# Patient Record
Sex: Female | Born: 1945 | Race: Black or African American | Hispanic: No | Smoking: Never smoker
Health system: Southern US, Community
[De-identification: ages and names within clinical notes are randomized; demographics above are authoritative.]

## PROBLEM LIST (undated history)

## (undated) DIAGNOSIS — I10 Essential (primary) hypertension: Secondary | ICD-10-CM

## (undated) DIAGNOSIS — E785 Hyperlipidemia, unspecified: Secondary | ICD-10-CM

## (undated) HISTORY — PX: ABDOMINAL HYSTERECTOMY: SHX81

---

## 2004-12-27 ENCOUNTER — Ambulatory Visit: Payer: Self-pay | Admitting: Unknown Physician Specialty

## 2005-11-03 ENCOUNTER — Ambulatory Visit: Payer: Self-pay | Admitting: Unknown Physician Specialty

## 2006-02-13 ENCOUNTER — Ambulatory Visit: Payer: Self-pay | Admitting: Unknown Physician Specialty

## 2007-02-19 ENCOUNTER — Ambulatory Visit: Payer: Self-pay | Admitting: Unknown Physician Specialty

## 2007-04-04 ENCOUNTER — Ambulatory Visit: Payer: Self-pay | Admitting: Gastroenterology

## 2008-03-03 ENCOUNTER — Ambulatory Visit: Payer: Self-pay | Admitting: Unknown Physician Specialty

## 2008-03-06 ENCOUNTER — Ambulatory Visit: Payer: Self-pay | Admitting: Unknown Physician Specialty

## 2009-03-12 ENCOUNTER — Ambulatory Visit: Payer: Self-pay | Admitting: Unknown Physician Specialty

## 2010-05-09 ENCOUNTER — Ambulatory Visit: Payer: Self-pay | Admitting: Unknown Physician Specialty

## 2010-06-06 ENCOUNTER — Ambulatory Visit: Payer: Self-pay | Admitting: Unknown Physician Specialty

## 2011-07-06 ENCOUNTER — Ambulatory Visit: Payer: Self-pay | Admitting: Unknown Physician Specialty

## 2012-07-09 ENCOUNTER — Ambulatory Visit: Payer: Self-pay | Admitting: Physician Assistant

## 2013-07-10 ENCOUNTER — Ambulatory Visit: Payer: Self-pay | Admitting: Physician Assistant

## 2013-08-04 ENCOUNTER — Ambulatory Visit: Payer: Self-pay | Admitting: Gastroenterology

## 2014-04-28 ENCOUNTER — Ambulatory Visit: Payer: Self-pay | Admitting: Family Medicine

## 2014-07-13 ENCOUNTER — Ambulatory Visit: Payer: Self-pay | Admitting: Physician Assistant

## 2015-06-24 ENCOUNTER — Other Ambulatory Visit: Payer: Self-pay | Admitting: Physician Assistant

## 2015-06-24 DIAGNOSIS — Z1231 Encounter for screening mammogram for malignant neoplasm of breast: Secondary | ICD-10-CM

## 2015-07-15 ENCOUNTER — Other Ambulatory Visit: Payer: Self-pay | Admitting: Physician Assistant

## 2015-07-15 ENCOUNTER — Ambulatory Visit
Admission: RE | Admit: 2015-07-15 | Discharge: 2015-07-15 | Disposition: A | Discharge status: Home / Self Care | Payer: Medicare Other | Source: Ambulatory Visit | Attending: Physician Assistant | Admitting: Physician Assistant

## 2015-07-15 DIAGNOSIS — Z1231 Encounter for screening mammogram for malignant neoplasm of breast: Secondary | ICD-10-CM | POA: Diagnosis present

## 2015-09-30 DIAGNOSIS — D239 Other benign neoplasm of skin, unspecified: Secondary | ICD-10-CM

## 2015-09-30 HISTORY — DX: Other benign neoplasm of skin, unspecified: D23.9

## 2016-05-31 ENCOUNTER — Ambulatory Visit
Admission: EM | Admit: 2016-05-31 | Discharge: 2016-05-31 | Disposition: A | Discharge status: Home / Self Care | Payer: Medicare Other | Attending: Family Medicine | Admitting: Family Medicine

## 2016-05-31 DIAGNOSIS — S1096XA Insect bite of unspecified part of neck, initial encounter: Secondary | ICD-10-CM

## 2016-05-31 DIAGNOSIS — W57XXXA Bitten or stung by nonvenomous insect and other nonvenomous arthropods, initial encounter: Secondary | ICD-10-CM

## 2016-05-31 HISTORY — DX: Hyperlipidemia, unspecified: E78.5

## 2016-05-31 HISTORY — DX: Essential (primary) hypertension: I10

## 2016-05-31 MED ORDER — MUPIROCIN 2 % EX OINT: TOPICAL_OINTMENT | CUTANEOUS | Status: DC

## 2016-05-31 NOTE — ED Notes (Signed)
Patient complains of a tick bite. Patient states that she found the tick on July 1st on the nape of her neck. Patient states that she brought the tick with her today. Patient states that she has noticed some redness and swelling where the tick was.

## 2016-05-31 NOTE — Discharge Instructions (Signed)
Use medication as prescribed. Monitor area. Avoid scratching.   Follow up with your primary care physician this week as needed. Prompt follow up as discussed for any symptoms including headache, rash, joint pain, fevers, weakness or other concerns. Return to Urgent care for new or worsening concerns.    Tick Bite Information Ticks are insects that attach themselves to the skin and draw blood for food. There are various types of ticks. Common types include wood ticks and deer ticks. Most ticks live in shrubs and grassy areas. Ticks can climb onto your body when you make contact with leaves or grass where the tick is waiting. The most common places on the body for ticks to attach themselves are the scalp, neck, armpits, waist, and groin. Most tick bites are harmless, but sometimes ticks carry germs that cause diseases. These germs can be spread to a person during the tick's feeding process. The chance of a disease spreading through a tick bite depends on:   The type of tick.  Time of year.   How long the tick is attached.   Geographic location.  HOW CAN YOU PREVENT TICK BITES? Take these steps to help prevent tick bites when you are outdoors:  Wear protective clothing. Long sleeves and long pants are best.   Wear white clothes so you can see ticks more easily.  Tuck your pant legs into your socks.   If walking on a trail, stay in the middle of the trail to avoid brushing against bushes.  Avoid walking through areas with long grass.  Put insect repellent on all exposed skin and along boot tops, pant legs, and sleeve cuffs.   Check clothing, hair, and skin repeatedly and before going inside.   Brush off any ticks that are not attached.  Take a shower or bath as soon as possible after being outdoors.  WHAT IS THE PROPER WAY TO REMOVE A TICK? Ticks should be removed as soon as possible to help prevent diseases caused by tick bites.  If latex gloves are available, put them  on before trying to remove a tick.   Using fine-point tweezers, grasp the tick as close to the skin as possible. You may also use curved forceps or a tick removal tool. Grasp the tick as close to its head as possible. Avoid grasping the tick on its body.  Pull gently with steady upward pressure until the tick lets go. Do not twist the tick or jerk it suddenly. This may break off the tick's head or mouth parts.  Do not squeeze or crush the tick's body. This could force disease-carrying fluids from the tick into your body.   After the tick is removed, wash the bite area and your hands with soap and water or other disinfectant such as alcohol.  Apply a small amount of antiseptic cream or ointment to the bite site.   Wash and disinfect any instruments that were used.  Do not try to remove a tick by applying a hot match, petroleum jelly, or fingernail polish to the tick. These methods do not work and may increase the chances of disease being spread from the tick bite.  WHEN SHOULD YOU SEEK MEDICAL CARE? Contact your health care provider if you are unable to remove a tick from your skin or if a part of the tick breaks off and is stuck in the skin.  After a tick bite, you need to be aware of signs and symptoms that could be related to diseases spread  by ticks. Contact your health care provider if you develop any of the following in the days or weeks after the tick bite:  Unexplained fever.  Rash. A circular rash that appears days or weeks after the tick bite may indicate the possibility of Lyme disease. The rash may resemble a target with a bull's-eye and may occur at a different part of your body than the tick bite.  Redness and swelling in the area of the tick bite.   Tender, swollen lymph glands.   Diarrhea.   Weight loss.   Cough.   Fatigue.   Muscle, joint, or bone pain.   Abdominal pain.   Headache.   Lethargy or a change in your level of  consciousness.  Difficulty walking or moving your legs.   Numbness in the legs.   Paralysis.  Shortness of breath.   Confusion.   Repeated vomiting.    This information is not intended to replace advice given to you by your health care provider. Make sure you discuss any questions you have with your health care provider.   Document Released: 11/03/2000 Document Revised: 11/27/2014 Document Reviewed: 04/16/2013 Elsevier Interactive Patient Education 2016 Beaver, flies, fleas, bedbugs, and many other insects can bite. Insect bites are different from insect stings. A sting is when poison (venom) is injected into the skin. Insect bites can cause pain or itching for a few days, but they are usually not serious. Some insects can spread diseases to people through a bite. SYMPTOMS  Symptoms of an insect bite include:  Itching or pain in the bite area.  Redness and swelling in the bite area.  An open wound (skin ulcer). In many cases, symptoms last for 2-4 days.  DIAGNOSIS  This condition is usually diagnosed based on symptoms and a physical exam. TREATMENT  Treatment is usually not needed for an insect bite. Symptoms often go away on their own. Your health care provider may recommend creams or lotions to help reduce itching. Antibiotic medicines may be prescribed if the bite becomes infected. A tetanus shot may be given in some cases. If you develop an allergic reaction to an insect bite, your health care provider will prescribe medicines to treat the reaction (antihistamines). This is rare. HOME CARE INSTRUCTIONS  Do not scratch the bite area.  Keep the bite area clean and dry. Wash the bite area daily with soap and water as told by your health care provider.  If directed, applyice to the bite area.  Put ice in a plastic bag.  Place a towel between your skin and the bag.  Leave the ice on for 20 minutes, 2-3 times per day.  To help reduce  itching and swelling, try applying a baking soda paste, cortisone cream, or calamine lotion to the bite area as told by your health care provider.  Apply or take over-the-counter and prescription medicines only as told by your health care provider.  If you were prescribed an antibiotic medicine, use it as told by your health care provider. Do not stop using the antibiotic even if your condition improves.  Keep all follow-up visits as told by your health care provider. This is important. PREVENTION   Use insect repellent. The best insect repellents contain:  DEET, picaridin, oil of lemon eucalyptus (OLE), or IR3535.  Higher amounts of an active ingredient.  When you are outdoors, wear clothing that covers your arms and legs.  Avoid opening windows that do not have window screens.  SEEK MEDICAL CARE IF:  You have increased redness, swelling, or pain in the bite area.  You have a fever. SEEK IMMEDIATE MEDICAL CARE IF:   You have joint pain.   You have fluid, blood, or pus coming from the bite area.  You have a headache or neck pain.  You have unusual weakness.  You have a rash.  You have chest pain or shortness of breath.  You have abdominal pain, nausea, or vomiting.  You feel unusually tired or sleepy.   This information is not intended to replace advice given to you by your health care provider. Make sure you discuss any questions you have with your health care provider.   Document Released: 12/14/2004 Document Revised: 07/28/2015 Document Reviewed: 03/24/2015 Elsevier Interactive Patient Education [employer].

## 2016-05-31 NOTE — ED Provider Notes (Signed)
[facility]  ____________________________________________  Time seen: Approximately 10:36 AM  I have reviewed the triage vital signs and the nursing notes.   HISTORY  Chief Complaint Insect Bite   HPI Stephanie Lynn is a 70 y.o. female   presents for evaluation of the area where she was bitten by tick. Patient reports in July 1 she had a tick in the back of her head in which her daughter removed. Patient reports that she had also found one crawling in her bathroom floor that was not attached to her. Denies any other tick bite or tick attachment. States unsure exactly how long the tick was attached but states thinks it is less than a few hours. Patient states that she feels well, but states she came in today as she feels there is a slight bump where the tick was attached and it itches.  Denies fevers, joint pain, body aches, headaches, weakness, nausea, vomiting, diarrhea, chest pain, shortness of breath, dysuria, neck pain, back pain or extremity swelling. Patient states that she does have some intermittent runny nose, nasal congestion and sinus congestion consistent with her seasonal allergies. Denies any other complaints or symptoms.    Past Medical History  Diagnosis Date  . Hypertension   . Hyperlipidemia     There are no active problems to display for this patient.   Past Surgical History  Procedure Laterality Date  . Abdominal hysterectomy      Current Outpatient Rx  Name  Route  Sig  Dispense  Refill  . aspirin EC 81 MG tablet   Oral   Take 81 mg by mouth daily.         Marland Kitchen atorvastatin (LIPITOR) 20 MG tablet   Oral   Take 20 mg by mouth daily.         . Calcium Carbonate-Vit D-Min (CALCIUM 1200 PO)   Oral   Take by mouth.         . triamterene-hydrochlorothiazide (MAXZIDE-25) 37.5-25 MG tablet   Oral   Take 1 tablet by mouth daily.           Allergies Codeine  Family History  Problem Relation Age of Onset  . Cirrhosis Father      Social History Social History  Substance Use Topics  . Smoking status: Never Smoker   . Smokeless tobacco: None  . Alcohol Use: No   Review of Systems Constitutional: No fever/chills Eyes: No visual changes. ENT: No sore throat. Cardiovascular: Denies chest pain. Respiratory: Denies shortness of breath. Gastrointestinal: No abdominal pain. No nausea, no vomiting. No diarrhea. No constipation. Genitourinary: Negative for dysuria. Musculoskeletal: Negative for back pain. Skin: Negative for rash. As above.  Neurological: Negative for headaches, focal weakness or numbness.  10-point ROS otherwise negative.  ____________________________________________   PHYSICAL EXAM:  VITAL SIGNS: ED Triage Vitals  Enc Vitals Group     BP 05/31/16 1032 128/47 mmHg     Pulse Rate 05/31/16 1032 80     Resp 05/31/16 1032 17     Temp 05/31/16 1032 97.7 F (36.5 C)     Temp Source 05/31/16 1032 Tympanic     SpO2 05/31/16 1032 100 %     Weight 05/31/16 1032 129 lb (58.514 kg)     Height 05/31/16 1032 5\' 6"  (1.676 m)     Head Cir --      Peak Flow --      Pain Score --      Pain Loc --  Pain Edu? --      Excl. in [place]? --     Constitutional: Alert and oriented. Well appearing and in no acute distress. Eyes: Conjunctivae are normal. PERRL. EOMI. Head: Atraumatic.  Ears: no erythema, normal TMs bilaterally.   Nose: No congestion/rhinnorhea.  Mouth/Throat: Mucous membranes are moist.  Oropharynx non-erythematous. No tonsillar swelling or exudate.  Neck: No stridor.  No cervical spine tenderness to palpation. Hematological/Lymphatic/Immunilogical: No cervical lymphadenopathy. Cardiovascular: Normal rate, regular rhythm. Grossly normal heart sounds.  Good peripheral circulation. Respiratory: Normal respiratory effort.  No retractions. Lungs CTAB. No wheezes, rales or rhonchi.  Gastrointestinal: Soft and nontender. No distention. No CVA tenderness. Musculoskeletal: No lower or upper  extremity tenderness nor edema.  Neurologic:  Normal speech and language. No gross focal neurologic deficits are appreciated. No gait instability. No meningismus.  Skin:  Skin is warm, dry and intact. No rash noted. Except:  Less than 1 cm area of minimally indurated skin at posterior occiput of hairline which patient reports the same as tick bite site; No erythema, no swelling, no surrounding erythema or rash. No neck tenderness. No bulls-eye rash.  Psychiatric: Mood and affect are normal. Speech and behavior are normal.  ____________________________________________   LABS (all labs ordered are listed, but only abnormal results are displayed)  Labs Reviewed - No data to display  RADIOLOGY  No results found. ____________________________________________   INITIAL IMPRESSION / ASSESSMENT AND PLAN / ED COURSE  Pertinent labs & imaging results that were available during my care of the patient were reviewed by me and considered in my medical decision making (see chart for details).  Very well-appearing patient. No acute distress. Presents for complaint and evaluation of recent tick bite. Less than 1 cm area of minimally indurated skin at posterior occiput of hairline which patient reports the same as tick bite site. No erythema, surrounding erythema or rash. No neck tenderness. Denies other complaints. Suspect local reaction from tick bite and will treat with topical Bactroban. Discussed with patient evaluation of [facility] spotted fever and Lyme's disease labs, and patient declines these labs. Patient states that she'll follow up with her primary care physician as needed but does not want any lab work drawn at this time as she feels well. Encourage patient to closely monitor the area and have daughter checked the area as well.Discussed indication, risks and benefits of medications with patient.  Discussed follow up with Primary care physician this week. Discussed follow up and return  parameters including no resolution or any worsening concerns. Patient verbalized understanding and agreed to plan.   ____________________________________________   FINAL CLINICAL IMPRESSION(S) / ED DIAGNOSES  Final diagnoses:  Tick bite of neck, initial encounter     Discharge Medication List as of 05/31/2016 10:47 AM    START taking these medications   Details  mupirocin ointment (BACTROBAN) 2 % Apply three times a day for 5 days., Normal        Note: This dictation was prepared with Dragon dictation along with smaller phrase technology. Any transcriptional errors that result from this process are unintentional.       Marylene Land, NP 05/31/16 1249

## 2016-06-05 ENCOUNTER — Other Ambulatory Visit: Payer: Self-pay | Admitting: Physician Assistant

## 2016-06-05 DIAGNOSIS — Z1231 Encounter for screening mammogram for malignant neoplasm of breast: Secondary | ICD-10-CM

## 2016-07-17 ENCOUNTER — Other Ambulatory Visit: Payer: Self-pay | Admitting: Physician Assistant

## 2016-07-17 ENCOUNTER — Ambulatory Visit
Admission: RE | Admit: 2016-07-17 | Discharge: 2016-07-17 | Disposition: A | Discharge status: Home / Self Care | Payer: Medicare Other | Source: Ambulatory Visit | Attending: Physician Assistant | Admitting: Physician Assistant

## 2016-07-17 DIAGNOSIS — Z1231 Encounter for screening mammogram for malignant neoplasm of breast: Secondary | ICD-10-CM

## 2017-06-12 ENCOUNTER — Other Ambulatory Visit: Payer: Self-pay | Admitting: Physician Assistant

## 2017-06-12 DIAGNOSIS — Z1231 Encounter for screening mammogram for malignant neoplasm of breast: Secondary | ICD-10-CM

## 2017-07-19 ENCOUNTER — Ambulatory Visit
Admission: RE | Admit: 2017-07-19 | Discharge: 2017-07-19 | Disposition: A | Discharge status: Home / Self Care | Payer: Medicare Other | Source: Ambulatory Visit | Attending: Physician Assistant | Admitting: Physician Assistant

## 2017-07-19 DIAGNOSIS — Z1231 Encounter for screening mammogram for malignant neoplasm of breast: Secondary | ICD-10-CM | POA: Diagnosis not present

## 2017-10-03 ENCOUNTER — Emergency Department (HOSPITAL_COMMUNITY)
Admission: EM | Admit: 2017-10-03 | Discharge: 2017-10-03 | Disposition: A | Discharge status: Home / Self Care | Payer: Medicare Other | Attending: Emergency Medicine | Admitting: Emergency Medicine

## 2017-10-03 ENCOUNTER — Encounter (HOSPITAL_COMMUNITY): Payer: Self-pay | Admitting: Emergency Medicine

## 2017-10-03 ENCOUNTER — Other Ambulatory Visit: Payer: Self-pay

## 2017-10-03 DIAGNOSIS — L509 Urticaria, unspecified: Secondary | ICD-10-CM

## 2017-10-03 DIAGNOSIS — Z7982 Long term (current) use of aspirin: Secondary | ICD-10-CM | POA: Insufficient documentation

## 2017-10-03 DIAGNOSIS — I10 Essential (primary) hypertension: Secondary | ICD-10-CM | POA: Insufficient documentation

## 2017-10-03 DIAGNOSIS — R21 Rash and other nonspecific skin eruption: Secondary | ICD-10-CM | POA: Diagnosis present

## 2017-10-03 DIAGNOSIS — Z79899 Other long term (current) drug therapy: Secondary | ICD-10-CM | POA: Insufficient documentation

## 2017-10-03 MED ORDER — FAMOTIDINE 20 MG PO TABS
20.0000 mg | ORAL_TABLET | Freq: Once | ORAL | Status: AC
  Administered 2017-10-03: 20 mg via ORAL
  Filled 2017-10-03: qty 1

## 2017-10-03 MED ORDER — DIPHENHYDRAMINE HCL 25 MG PO CAPS
25.0000 mg | ORAL_CAPSULE | Freq: Once | ORAL | Status: AC
  Administered 2017-10-03: 25 mg via ORAL
  Filled 2017-10-03: qty 1

## 2017-10-03 MED ORDER — FAMOTIDINE 20 MG PO TABS: 20.0000 mg | ORAL_TABLET | Freq: Two times a day (BID) | ORAL | 0 refills | Status: DC

## 2017-10-03 NOTE — ED Triage Notes (Signed)
Pt st's she was sleeping and woke up approx 2 hours ago itching all over.  Pt has rash on bil legs, arms and torso.  No resp distress present

## 2017-10-03 NOTE — ED Provider Notes (Signed)
[name] Provider Note   CSN: [id number] Arrival date & time: 10/03/17  6073     History   Chief Complaint Chief Complaint  Patient presents with  . Allergic Reaction    HPI Stephanie Lynn is a 71 y.o. female.  Patient presents to the emergency department with chief complaint of rash.  She states that she was awakened at 11 PM this evening with a rash on her torso, arms, and legs.  She describes the rash as itchy.  She denies any pain.  Denies any fever, chills, nausea, vomiting, diarrhea, shortness of breath, or throat swelling sensation.  She has not taken anything for symptoms.  There are no modifying factors.  She denies any potential allergic contacts.  Denies any new chemical contacts.  Denies any new foods.  She has not had anything like this before.  She does report that she was bitten by a tick about 2 months ago.  She denies any muscle aches, joint pain, or fatigue.   The history is provided by the patient. No language interpreter was used.    Past Medical History:  Diagnosis Date  . Hyperlipidemia   . Hypertension     There are no active problems to display for this patient.   Past Surgical History:  Procedure Laterality Date  . ABDOMINAL HYSTERECTOMY      OB History    No data available       Home Medications    Prior to Admission medications   Medication Sig Start Date End Date Taking? Authorizing Provider  aspirin EC 81 MG tablet Take 81 mg by mouth daily.    [provider]  atorvastatin (LIPITOR) 20 MG tablet Take 20 mg by mouth daily.    [provider]  Calcium Carbonate-Vit D-Min (CALCIUM 1200 PO) Take by mouth.    [provider]  mupirocin ointment (BACTROBAN) 2 % Apply three times a day for 5 days. 05/31/16   Marylene Land, NP  triamterene-hydrochlorothiazide (MAXZIDE-25) 37.5-25 MG tablet Take 1 tablet by mouth daily.    [provider]    Family  History Family History  Problem Relation Age of Onset  . Cirrhosis Father   . Breast cancer Neg Hx     Social History Social History   Tobacco Use  . Smoking status: Never Smoker  Substance Use Topics  . Alcohol use: No    Alcohol/week: 0.0 oz  . Drug use: No     Allergies   Codeine   Review of Systems Review of Systems  All other systems reviewed and are negative.    Physical Exam Updated Vital Signs BP (!) 154/81 (BP Location: Right Arm)   Pulse 83   Temp 97.8 F (36.6 C) (Oral)   Resp 18   Ht 5\' 4"  (1.626 m)   Wt 62.1 kg (137 lb)   SpO2 97%   BMI 23.52 kg/m   Physical Exam  Constitutional: She is oriented to person, place, and time. She appears well-developed and well-nourished.  HENT:  Head: Normocephalic and atraumatic.  Eyes: Conjunctivae and EOM are normal. Pupils are equal, round, and reactive to light.  Neck: Normal range of motion. Neck supple.  Cardiovascular: Normal rate and regular rhythm. Exam reveals no gallop and no friction rub.  No murmur heard. Pulmonary/Chest: Effort normal and breath sounds normal. No respiratory distress. She has no wheezes. She has no rales. She exhibits no tenderness.  Abdominal: Soft. Bowel sounds are normal.  She exhibits no distension and no mass. There is no tenderness. There is no rebound and no guarding.  Musculoskeletal: Normal range of motion. She exhibits no edema or tenderness.  Neurological: She is alert and oriented to person, place, and time.  Skin: Skin is warm and dry.  Psychiatric: She has a normal mood and affect. Her behavior is normal. Judgment and thought content normal.  Nursing note and vitals reviewed.    ED Treatments / Results  Labs (all labs ordered are listed, but only abnormal results are displayed) Labs Reviewed - No data to display  EKG  EKG Interpretation None       Radiology No results found.  Procedures Procedures (including critical care time)  Medications Ordered in  ED Medications  diphenhydrAMINE (BENADRYL) capsule 25 mg (25 mg Oral Given 10/03/17 0503)  famotidine (PEPCID) tablet 20 mg (20 mg Oral Given 10/03/17 0503)     Initial Impression / Assessment and Plan / ED Course  I have reviewed the triage vital signs and the nursing notes.  Pertinent labs & imaging results that were available during my care of the patient were reviewed by me and considered in my medical decision making (see chart for details).     Patient with pruritic rash.  Appears consistent with hives.  No new potential irritants.  No fever or chills.  No throat swelling, wheezing, or GI symptoms.  Patient feels significantly improved after Benadryl and Pepcid.  However, the rash remains.  Patient with Dr. Leonides Schanz, who also saw the rash, and agrees with plan for discharge home.  Return precautions discussed.  Final Clinical Impressions(s) / ED Diagnoses   Final diagnoses:  Hives    ED Discharge Orders        Ordered    famotidine (PEPCID) 20 MG tablet  2 times daily     10/03/17 0545       Montine Circle, PA-C 10/03/17 [place], Delice Bison, DO 10/03/17 [id number]

## 2018-06-10 ENCOUNTER — Other Ambulatory Visit: Payer: Self-pay | Admitting: Physician Assistant

## 2018-06-10 DIAGNOSIS — Z1231 Encounter for screening mammogram for malignant neoplasm of breast: Secondary | ICD-10-CM

## 2018-08-01 ENCOUNTER — Ambulatory Visit
Admission: RE | Admit: 2018-08-01 | Discharge: 2018-08-01 | Disposition: A | Discharge status: Home / Self Care | Payer: Medicare Other | Source: Ambulatory Visit | Attending: Physician Assistant | Admitting: Physician Assistant

## 2018-08-01 DIAGNOSIS — Z1231 Encounter for screening mammogram for malignant neoplasm of breast: Secondary | ICD-10-CM | POA: Diagnosis present

## 2019-06-24 ENCOUNTER — Other Ambulatory Visit: Payer: Self-pay | Admitting: Physician Assistant

## 2019-06-24 DIAGNOSIS — Z1231 Encounter for screening mammogram for malignant neoplasm of breast: Secondary | ICD-10-CM

## 2019-08-04 ENCOUNTER — Ambulatory Visit
Admission: RE | Admit: 2019-08-04 | Discharge: 2019-08-04 | Disposition: A | Discharge status: Home / Self Care | Payer: Medicare Other | Source: Ambulatory Visit | Attending: Physician Assistant | Admitting: Physician Assistant

## 2019-08-04 DIAGNOSIS — Z1231 Encounter for screening mammogram for malignant neoplasm of breast: Secondary | ICD-10-CM | POA: Insufficient documentation

## 2019-08-06 ENCOUNTER — Other Ambulatory Visit: Payer: Self-pay | Admitting: Physician Assistant

## 2019-08-06 DIAGNOSIS — K30 Functional dyspepsia: Secondary | ICD-10-CM

## 2019-08-15 ENCOUNTER — Ambulatory Visit
Admission: RE | Admit: 2019-08-15 | Discharge: 2019-08-15 | Disposition: A | Discharge status: Home / Self Care | Payer: Medicare Other | Source: Ambulatory Visit | Attending: Physician Assistant | Admitting: Physician Assistant

## 2019-08-15 ENCOUNTER — Other Ambulatory Visit: Payer: Self-pay

## 2019-08-15 DIAGNOSIS — K30 Functional dyspepsia: Secondary | ICD-10-CM | POA: Diagnosis not present

## 2020-02-07 ENCOUNTER — Ambulatory Visit: Payer: Medicare Other | Attending: Internal Medicine

## 2020-02-07 DIAGNOSIS — Z23 Encounter for immunization: Secondary | ICD-10-CM

## 2020-02-07 NOTE — Progress Notes (Signed)
   Covid-19 Vaccination Clinic  Name:  Stephanie Lynn    MRN: [id number] DOB: Feb 07, 1946  02/07/2020  [name] was observed post Covid-19 immunization for 30 minutes based on pre-vaccination screening without incident. She was provided with Vaccine Information Sheet and instruction to access the V-Safe system.   [name] was instructed to call 911 with any severe reactions post vaccine: Marland Kitchen Difficulty breathing  . Swelling of face and throat  . A fast heartbeat  . A bad rash all over body  . Dizziness and weakness   Immunizations Administered    Name Date Dose VIS Date Route   Pfizer COVID-19 Vaccine 02/07/2020  9:13 AM 0.3 mL 10/31/2019 Intramuscular   Manufacturer: [place]   Lot: [id number]   [place]: [id number]

## 2020-02-28 ENCOUNTER — Ambulatory Visit: Payer: Medicare Other | Attending: Internal Medicine

## 2020-02-28 DIAGNOSIS — Z23 Encounter for immunization: Secondary | ICD-10-CM

## 2020-02-28 NOTE — Progress Notes (Signed)
   Covid-19 Vaccination Clinic  Name:  Eilley Lebreton    MRN: [id number] DOB: [birth year]  02/28/2020  Ms. Silfies was observed post Covid-19 immunization for 15 minutes without incident. She was provided with Vaccine Information Sheet and instruction to access the V-Safe system.   Ms. Henshall was instructed to call 911 with any severe reactions post vaccine: Marland Kitchen Difficulty breathing  . Swelling of face and throat  . A fast heartbeat  . A bad rash all over body  . Dizziness and weakness   Immunizations Administered    Name Date Dose VIS Date Route   Pfizer COVID-19 Vaccine 02/28/2020  8:55 AM 0.3 mL 10/31/2019 Intramuscular   Manufacturer: [place]   Lot: [id number]   [place]: [id number]

## 2020-06-28 ENCOUNTER — Other Ambulatory Visit: Payer: Self-pay | Admitting: Physician Assistant

## 2020-06-28 DIAGNOSIS — Z1231 Encounter for screening mammogram for malignant neoplasm of breast: Secondary | ICD-10-CM

## 2020-08-05 ENCOUNTER — Other Ambulatory Visit: Payer: Self-pay

## 2020-08-05 ENCOUNTER — Ambulatory Visit
Admission: RE | Admit: 2020-08-05 | Discharge: 2020-08-05 | Disposition: A | Discharge status: Home / Self Care | Payer: Medicare Other | Source: Ambulatory Visit | Attending: Physician Assistant | Admitting: Physician Assistant

## 2020-08-05 DIAGNOSIS — Z1231 Encounter for screening mammogram for malignant neoplasm of breast: Secondary | ICD-10-CM | POA: Insufficient documentation

## 2020-09-07 ENCOUNTER — Other Ambulatory Visit: Payer: Self-pay

## 2020-09-07 ENCOUNTER — Encounter: Payer: Self-pay | Admitting: Dermatology

## 2020-09-07 ENCOUNTER — Ambulatory Visit: Payer: Medicare Other | Admitting: Dermatology

## 2020-09-07 DIAGNOSIS — D2271 Melanocytic nevi of right lower limb, including hip: Secondary | ICD-10-CM

## 2020-09-07 DIAGNOSIS — Z86018 Personal history of other benign neoplasm: Secondary | ICD-10-CM

## 2020-09-07 DIAGNOSIS — L578 Other skin changes due to chronic exposure to nonionizing radiation: Secondary | ICD-10-CM

## 2020-09-07 DIAGNOSIS — D229 Melanocytic nevi, unspecified: Secondary | ICD-10-CM

## 2020-09-07 NOTE — Progress Notes (Signed)
   Follow-Up Visit   Subjective  Stephanie Lynn is a 74 y.o. female who presents for the following: Nevus (check nevus on R 5th toe, pcp feels it has changed in size) and Hx of dysplastic nevus (Hx of mild dysplastic L mid to upper back paraspinal, hx of melanocytic nevus R lateral elbow).  The following portions of the chart were reviewed this encounter and updated as appropriate: Allergies  Meds  Problems  Med Hx  Surg Hx  Fam Hx     Review of Systems: No other skin or systemic complaints except as noted in HPI or Assessment and Plan.   Objective  Well appearing patient in no apparent distress; mood and affect are within normal limits.  A focused examination was performed including right foot and right elbow. Relevant physical exam findings are noted in the Assessment and Plan.  Objective  Right 5th Distal Toe: 0.6 cm x 0.45 cm multi-lobular flat brown macule. Unremarkable under dermoscopy.  Images    Objective  Right lateral elbow: scar    Assessment & Plan  Nevus Right 5th Distal Toe  Favor two adjacent nevi. Benign-appearing.  Observation.  Call clinic for new or changing lesions.  Recommend daily use of broad spectrum spf 30+ sunscreen to sun-exposed areas.   Observe for growth, change in shape or color, and raised elevation.   Discussed possible shave removal in future if any changes noticed.     History of dysplastic nevus Right lateral elbow  Benign-appearing.  Observation.  Call clinic for new or changing moles.  Recommend daily use of broad spectrum spf 30+ sunscreen to sun-exposed areas.    Actinic Damage - diffuse scaly erythematous macules with underlying dyspigmentation - Recommend daily broad spectrum sunscreen SPF 30+ to sun-exposed areas, reapply every 2 hours as needed.  - Call for new or changing lesions.  Return in about 3 months (around 12/08/2020) for recheck nevus R 5th toe.   I, Harriett Sine, CMA, am acting as scribe for  Forest Gleason, MD.  Documentation: I have reviewed the above documentation for accuracy and completeness, and I agree with the above.  Forest Gleason, MD

## 2020-09-07 NOTE — Patient Instructions (Signed)
Recommend taking Heliocare sun protection supplement daily in sunny weather for additional sun protection. For maximum protection on the sunniest days, you can take up to 2 capsules of regular Heliocare OR take 1 capsule of Heliocare Ultra. For prolonged exposure (such as a full day in the sun), you can repeat your dose of the supplement 4 hours after your first dose. Heliocare can be purchased at [facility] or at [email or web address].       

## 2020-09-14 ENCOUNTER — Encounter: Payer: Self-pay | Admitting: Dermatology

## 2020-12-22 ENCOUNTER — Ambulatory Visit: Payer: Medicare Other | Admitting: Dermatology

## 2020-12-28 ENCOUNTER — Encounter: Payer: Self-pay | Admitting: Dermatology

## 2020-12-28 ENCOUNTER — Ambulatory Visit: Payer: Medicare Other | Admitting: Dermatology

## 2020-12-28 ENCOUNTER — Other Ambulatory Visit: Payer: Self-pay

## 2020-12-28 DIAGNOSIS — Z86018 Personal history of other benign neoplasm: Secondary | ICD-10-CM

## 2020-12-28 DIAGNOSIS — D2271 Melanocytic nevi of right lower limb, including hip: Secondary | ICD-10-CM | POA: Diagnosis not present

## 2020-12-28 DIAGNOSIS — D229 Melanocytic nevi, unspecified: Secondary | ICD-10-CM

## 2020-12-28 DIAGNOSIS — L821 Other seborrheic keratosis: Secondary | ICD-10-CM | POA: Diagnosis not present

## 2020-12-28 NOTE — Progress Notes (Signed)
   Follow-Up Visit   Subjective  Stephanie Lynn is a 75 y.o. female who presents for the following: Nevus (Patient here today for 3 month follow up to recheck nevus at right 5th toe. Patient has not noticed any change. ).  The following portions of the chart were reviewed this encounter and updated as appropriate:   Allergies  Meds  Problems  Med Hx  Surg Hx  Fam Hx      Review of Systems:  No other skin or systemic complaints except as noted in HPI or Assessment and Plan.  Objective  Well appearing patient in no apparent distress; mood and affect are within normal limits.  A focused examination was performed including right foot. Relevant physical exam findings are noted in the Assessment and Plan.  Objective  Right 5th toe: 0.6 cm x 0.45 cm multi-lobular flat brown macule   Assessment & Plan  Nevus Right 5th toe  Photo compared, stable  Benign-appearing.  Observation.  Call clinic for new or changing lesions.  Recommend daily use of broad spectrum spf 30+ sunscreen to sun-exposed areas.    Seborrheic Keratoses - Stuck-on, waxy, tan-brown papules and plaques  - Discussed benign etiology and prognosis. 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  - Call if any new or changing lesions are noted between office visits   Return for TBSE.  Graciella Belton, RMA, am acting as scribe for Forest Gleason, MD .  Documentation: I have reviewed the above documentation for accuracy and completeness, and I agree with the above.  Forest Gleason, MD

## 2020-12-28 NOTE — Patient Instructions (Addendum)
Melanoma ABCDEs  Melanoma is the most dangerous type of skin cancer, and is the leading cause of death from skin disease.  You are more likely to develop melanoma if you:  Have light-colored skin, light-colored eyes, or red or blond hair  Spend a lot of time in the sun  Tan regularly, either outdoors or in a tanning bed  Have had blistering sunburns, especially during childhood  Have a close family member who has had a melanoma  Have atypical moles or large birthmarks  Early detection of melanoma is key since treatment is typically straightforward and cure rates are extremely high if we catch it early.   The first sign of melanoma is often a change in a mole or a new dark spot.  The ABCDE system is a way of remembering the signs of melanoma.  A for asymmetry:  The two halves do not match. B for border:  The edges of the growth are irregular. C for color:  A mixture of colors are present instead of an even brown color. D for diameter:  Melanomas are usually (but not always) greater than 6mm - the size of a pencil eraser. E for evolution:  The spot keeps changing in size, shape, and color.  Please check your skin once per month between visits. You can use a small mirror in front and a large mirror behind you to keep an eye on the back side or your body.   If you see any new or changing lesions before your next follow-up, please call to schedule a visit.  Please continue daily skin protection including broad spectrum sunscreen SPF 30+ to sun-exposed areas, reapplying every 2 hours as needed when you're outdoors.   Seborrheic Keratosis  What causes seborrheic keratoses? Seborrheic keratoses are harmless, common skin growths that first appear during adult life.  As time goes by, more growths appear.  Some people may develop a large number of them.  Seborrheic keratoses appear on both covered and uncovered body parts.  They are not caused by sunlight.  The tendency to develop seborrheic  keratoses can be inherited.  They vary in color from skin-colored to gray, brown, or even black.  They can be either smooth or have a rough, warty surface.   Seborrheic keratoses are superficial and look as if they were stuck on the skin.  Under the microscope this type of keratosis looks like layers upon layers of skin.  That is why at times the top layer may seem to fall off, but the rest of the growth remains and re-grows.    Treatment Seborrheic keratoses do not need to be treated, but can easily be removed in the office.  Seborrheic keratoses often cause symptoms when they rub on clothing or jewelry.  Lesions can be in the way of shaving.  If they become inflamed, they can cause itching, soreness, or burning.  Removal of a seborrheic keratosis can be accomplished by freezing, burning, or surgery. If any spot bleeds, scabs, or grows rapidly, please return to have it checked, as these can be an indication of a skin cancer.  

## 2021-03-09 ENCOUNTER — Ambulatory Visit: Payer: Medicare Other | Admitting: Dermatology

## 2021-03-09 ENCOUNTER — Other Ambulatory Visit: Payer: Self-pay

## 2021-03-09 DIAGNOSIS — D229 Melanocytic nevi, unspecified: Secondary | ICD-10-CM

## 2021-03-09 DIAGNOSIS — Z86018 Personal history of other benign neoplasm: Secondary | ICD-10-CM | POA: Diagnosis not present

## 2021-03-09 DIAGNOSIS — D2271 Melanocytic nevi of right lower limb, including hip: Secondary | ICD-10-CM

## 2021-03-09 DIAGNOSIS — L720 Epidermal cyst: Secondary | ICD-10-CM

## 2021-03-09 DIAGNOSIS — Z1283 Encounter for screening for malignant neoplasm of skin: Secondary | ICD-10-CM | POA: Diagnosis not present

## 2021-03-09 DIAGNOSIS — L578 Other skin changes due to chronic exposure to nonionizing radiation: Secondary | ICD-10-CM

## 2021-03-09 DIAGNOSIS — L821 Other seborrheic keratosis: Secondary | ICD-10-CM

## 2021-03-09 DIAGNOSIS — D1800 Hemangioma unspecified site: Secondary | ICD-10-CM

## 2021-03-09 NOTE — Patient Instructions (Addendum)
Melanoma ABCDEs  Melanoma is the most dangerous type of skin cancer, and is the leading cause of death from skin disease.  You are more likely to develop melanoma if you: Have light-colored skin, light-colored eyes, or red or blond hair Spend a lot of time in the sun Tan regularly, either outdoors or in a tanning bed Have had blistering sunburns, especially during childhood Have a close family member who has had a melanoma Have atypical moles or large birthmarks  Early detection of melanoma is key since treatment is typically straightforward and cure rates are extremely high if we catch it early.   The first sign of melanoma is often a change in a mole or a new dark spot.  The ABCDE system is a way of remembering the signs of melanoma.  A for asymmetry:  The two halves do not match. B for border:  The edges of the growth are irregular. C for color:  A mixture of colors are present instead of an even brown color. D for diameter:  Melanomas are usually (but not always) greater than 6mm - the size of a pencil eraser. E for evolution:  The spot keeps changing in size, shape, and color.  Please check your skin once per month between visits. You can use a small mirror in front and a large mirror behind you to keep an eye on the back side or your body.   If you see any new or changing lesions before your next follow-up, please call to schedule a visit.  Please continue daily skin protection including broad spectrum sunscreen SPF 30+ to sun-exposed areas, reapplying every 2 hours as needed when you're outdoors.   Staying in the shade or wearing long sleeves, sun glasses (UVA+UVB protection) and wide brim hats (4-inch brim around the entire circumference of the hat) are also recommended for sun protection.    Recommend taking Heliocare sun protection supplement daily in sunny weather for additional sun protection. For maximum protection on the sunniest days, you can take up to 2 capsules of  regular Heliocare OR take 1 capsule of Heliocare Ultra. For prolonged exposure (such as a full day in the sun), you can repeat your dose of the supplement 4 hours after your first dose. Heliocare can be purchased at [facility] or at [email or web address].    If you have any questions or concerns for your doctor, please call our main line at [phone] and press option 4 to reach your doctor's medical assistant. If no one answers, please leave a voicemail as directed and we will return your call as soon as possible. Messages left after 4 pm will be answered the following business day.   You may also send us a message via MyChart. We typically respond to MyChart messages within 1-2 business days.  For prescription refills, please ask your pharmacy to contact our office. Our fax number is [phone].  If you have an urgent issue when the clinic is closed that cannot wait until the next business day, you can page your doctor at the number below.    Please note that while we do our best to be available for urgent issues outside of office hours, we are not available 24/7.   If you have an urgent issue and are unable to reach us, you may choose to seek medical care at your doctor's office, retail clinic, urgent care center, or emergency room.  If you have a medical emergency, please immediately call 911 or go to   the emergency department.  Pager Numbers  - Dr. Kowalski: [phone]  - Dr. Moye: [phone]  - Dr. Stewart: [phone]  In the event of inclement weather, please call our main line at [phone] for an update on the status of any delays or closures.  Dermatology Medication Tips: Please keep the boxes that topical medications come in in order to help keep track of the instructions about where and how to use these. Pharmacies typically print the medication instructions only on the boxes and not directly on the medication tubes.   If your medication is too expensive,  please contact our office at [phone] option 4 or send us a message through MyChart.   We are unable to tell what your co-pay for medications will be in advance as this is different depending on your insurance coverage. However, we may be able to find a substitute medication at lower cost or fill out paperwork to get insurance to cover a needed medication.   If a prior authorization is required to get your medication covered by your insurance company, please allow us 1-2 business days to complete this process.  Drug prices often vary depending on where the prescription is filled and some pharmacies may offer cheaper prices.  The website www.goodrx.com contains coupons for medications through different pharmacies. The prices here do not account for what the cost may be with help from insurance (it may be cheaper with your insurance), but the website can give you the price if you did not use any insurance.  - You can print the associated coupon and take it with your prescription to the pharmacy.  - You may also stop by our office during regular business hours and pick up a GoodRx coupon card.  - If you need your prescription sent electronically to a different pharmacy, notify our office through [facility] MyChart or by phone at [phone] option 4.  

## 2021-03-09 NOTE — Progress Notes (Signed)
   Follow-Up Visit   Subjective  Stephanie Lynn is a 75 y.o. female who presents for the following: tbse (Patient here today for tbse. She reports no new spots of concern. She has history of dysplastic nevus on right lateral elbow. She also has a nevus on right 5th distal toe that we have been monitoring. ).   The following portions of the chart were reviewed this encounter and updated as appropriate:  Allergies  Meds  Problems  Med Hx  Surg Hx  Fam Hx      Objective  Well appearing patient in no apparent distress; mood and affect are within normal limits.  A full examination was performed including scalp, head, eyes, ears, nose, lips, neck, chest, axillae, abdomen, back, buttocks, bilateral upper extremities, bilateral lower extremities, hands, feet, fingers, toes, fingernails, and toenails. All findings within normal limits unless otherwise noted below.  Objective  right lateral thigh: 0.5 cm dark brown macule slightly lighter at lateral border  right 5th distal toe: Two connecting dark brown macules, larger 0.5 x 0.4 cm, smaller 0.2 cm  Images      Objective  left eye: Subcutaneous papule with central punctum  Assessment & Plan    Seborrheic Keratoses - Stuck-on, waxy, tan-brown papules and/or plaques  - Benign-appearing - Discussed benign etiology and prognosis. - Observe - Call for any changes  Hemangiomas - Red papules - Discussed benign nature - Observe - Call for any changes  Melanocytic Nevi - Tan-brown and/or pink-flesh-colored symmetric macules and papules - Benign appearing on exam today - Observation - Call clinic for new or changing moles - Recommend daily use of broad spectrum spf 30+ sunscreen to sun-exposed areas.   Actinic Damage - chronic, secondary to cumulative UV radiation exposure/sun exposure over time - diffuse scaly erythematous macules with underlying dyspigmentation - Recommend daily broad spectrum sunscreen SPF 30+ to  sun-exposed areas, reapply every 2 hours as needed.  - Recommend staying in the shade or wearing long sleeves, sun glasses (UVA+UVB protection) and wide brim hats (4-inch brim around the entire circumference of the hat). - Call for new or changing lesions.  Nevus (2) right lateral thigh; right 5th distal toe  Photo today of right lateral thigh  Will recheck at follow up  [place].  Observation.  Call clinic for new or changing lesions.  Recommend daily use of broad spectrum spf 30+ sunscreen to sun-exposed areas.    Epidermal inclusion cyst left eye  Benign-appearing. Exam most consistent with an epidermal inclusion cyst. Discussed that a cyst is a benign growth that can grow over time and sometimes get irritated or inflamed. Recommend observation if it is not bothersome. Discussed option of surgical excision to remove it if it is growing, symptomatic, or other changes noted. Please call for new or changing lesions so they can be evaluated.    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  - Call if any new or changing lesions are noted between office visits  Skin cancer screening performed today.  Return in about 1 year (around 03/09/2022) for tbse .  I, Ruthell Rummage, CMA, am acting as scribe for Forest Gleason, MD.   Documentation: I have reviewed the above documentation for accuracy and completeness, and I agree with the above.  Forest Gleason, MD

## 2021-03-14 ENCOUNTER — Encounter: Payer: Self-pay | Admitting: Dermatology

## 2021-06-09 ENCOUNTER — Other Ambulatory Visit: Payer: Self-pay | Admitting: Physician Assistant

## 2021-06-28 ENCOUNTER — Other Ambulatory Visit: Payer: Self-pay | Admitting: Physician Assistant

## 2021-06-28 DIAGNOSIS — Z1231 Encounter for screening mammogram for malignant neoplasm of breast: Secondary | ICD-10-CM

## 2021-08-09 ENCOUNTER — Ambulatory Visit
Admission: RE | Admit: 2021-08-09 | Discharge: 2021-08-09 | Disposition: A | Discharge status: Home / Self Care | Payer: Medicare Other | Source: Ambulatory Visit | Attending: Physician Assistant | Admitting: Physician Assistant

## 2021-08-09 ENCOUNTER — Other Ambulatory Visit: Payer: Self-pay

## 2021-08-09 DIAGNOSIS — Z1231 Encounter for screening mammogram for malignant neoplasm of breast: Secondary | ICD-10-CM | POA: Insufficient documentation

## 2021-08-18 ENCOUNTER — Other Ambulatory Visit: Payer: Self-pay | Admitting: Physician Assistant

## 2021-08-18 DIAGNOSIS — R928 Other abnormal and inconclusive findings on diagnostic imaging of breast: Secondary | ICD-10-CM

## 2021-08-18 DIAGNOSIS — N6489 Other specified disorders of breast: Secondary | ICD-10-CM

## 2021-08-19 ENCOUNTER — Ambulatory Visit
Admission: RE | Admit: 2021-08-19 | Discharge: 2021-08-19 | Disposition: A | Discharge status: Home / Self Care | Payer: Medicare Other | Source: Ambulatory Visit | Attending: Physician Assistant | Admitting: Physician Assistant

## 2021-08-19 ENCOUNTER — Other Ambulatory Visit: Payer: Self-pay

## 2021-08-19 DIAGNOSIS — N6489 Other specified disorders of breast: Secondary | ICD-10-CM | POA: Diagnosis present

## 2021-08-19 DIAGNOSIS — R928 Other abnormal and inconclusive findings on diagnostic imaging of breast: Secondary | ICD-10-CM | POA: Diagnosis not present

## 2022-03-09 ENCOUNTER — Encounter: Payer: Medicare Other | Admitting: Dermatology

## 2022-05-11 ENCOUNTER — Encounter: Payer: Medicare Other | Admitting: Dermatology

## 2022-06-14 ENCOUNTER — Ambulatory Visit: Payer: Medicare Other | Admitting: Dermatology

## 2022-06-14 DIAGNOSIS — S30861A Insect bite (nonvenomous) of abdominal wall, initial encounter: Secondary | ICD-10-CM

## 2022-06-14 DIAGNOSIS — Z1283 Encounter for screening for malignant neoplasm of skin: Secondary | ICD-10-CM | POA: Diagnosis not present

## 2022-06-14 DIAGNOSIS — L821 Other seborrheic keratosis: Secondary | ICD-10-CM

## 2022-06-14 DIAGNOSIS — L814 Other melanin hyperpigmentation: Secondary | ICD-10-CM

## 2022-06-14 DIAGNOSIS — D229 Melanocytic nevi, unspecified: Secondary | ICD-10-CM

## 2022-06-14 DIAGNOSIS — S20362A Insect bite (nonvenomous) of left front wall of thorax, initial encounter: Secondary | ICD-10-CM

## 2022-06-14 DIAGNOSIS — Z86018 Personal history of other benign neoplasm: Secondary | ICD-10-CM

## 2022-06-14 DIAGNOSIS — L578 Other skin changes due to chronic exposure to nonionizing radiation: Secondary | ICD-10-CM

## 2022-06-14 DIAGNOSIS — D235 Other benign neoplasm of skin of trunk: Secondary | ICD-10-CM

## 2022-06-14 DIAGNOSIS — D2271 Melanocytic nevi of right lower limb, including hip: Secondary | ICD-10-CM

## 2022-06-14 DIAGNOSIS — W57XXXA Bitten or stung by nonvenomous insect and other nonvenomous arthropods, initial encounter: Secondary | ICD-10-CM

## 2022-06-14 DIAGNOSIS — L304 Erythema intertrigo: Secondary | ICD-10-CM

## 2022-06-14 MED ORDER — CLOBETASOL PROPIONATE 0.05 % EX OINT: TOPICAL_OINTMENT | CUTANEOUS | 2 refills | Status: AC

## 2022-06-14 NOTE — Progress Notes (Signed)
Follow-Up Visit   Subjective  Stephanie Lynn is a 76 y.o. female who presents for the following: Annual Exam (1 year tbse, hx of dysplastic nevus, recheck nevus at right lateral thigh and right 5th distal toe, Patient concerned about possible bug bites at at left shoulder and abdomen. Would like something to help with itch. ).  The patient presents for Total-Body Skin Exam (TBSE) for skin cancer screening and mole check.  The patient has spots, moles and lesions to be evaluated, some may be new or changing and the patient has concerns that these could be cancer.  The following portions of the chart were reviewed this encounter and updated as appropriate:  Tobacco  Allergies  Meds  Problems  Med Hx  Surg Hx  Fam Hx      Review of Systems: No other skin or systemic complaints except as noted in HPI or Assessment and Plan.   Objective  Well appearing patient in no apparent distress; mood and affect are within normal limits.  A full examination was performed including scalp, head, eyes, ears, nose, lips, neck, chest, axillae, abdomen, back, buttocks, bilateral upper extremities, bilateral lower extremities, hands, feet, fingers, toes, fingernails, and toenails. All findings within normal limits unless otherwise noted below.  right lateral thigh 0.5 cm dark brown macule slightly lighter at lateral border  right 5th distal toe Two connecting dark brown macules, larger 0.5 x 0.4 cm, smaller 0.2 cm  left chest, abdomen erythematous papules   Assessment & Plan  Nevus (2) right lateral thigh; right 5th distal toe  Stable when compared to previous photos    Benign-appearing.  Observation.  Call clinic for new or changing lesions.  Recommend daily use of broad spectrum spf 30+ sunscreen to sun-exposed areas.    Bug bite without infection, initial encounter left chest, abdomen  Start clobetasol ointment 30g 2 rfs use dot ointment twice a day as needed for up to 2 weeks for  bites. Avoid applying to face, groin, and axilla. Use as directed. Long-term use can cause thinning of the skin.  Topical steroids (such as triamcinolone, fluocinolone, fluocinonide, mometasone, clobetasol, halobetasol, betamethasone, hydrocortisone) can cause thinning and lightening of the skin if they are used for too long in the same area. Your physician has selected the right strength medicine for your problem and area affected on the body. Please use your medication only as directed by your physician to prevent side effects.    clobetasol ointment (TEMOVATE) 0.05 % - left chest, abdomen Apply topically to aa of body bid for 2 weeks for bug bites. Avoid applying to face, groin, and axilla. Use as directed.  Erythema intertrigo Right Inframammary Fold  Intertrigo is a chronic recurrent rash that occurs in skin fold areas that may be associated with friction; heat; moisture; yeast; fungus; and bacteria.  It is exacerbated by increased movement / activity; sweating; and higher atmospheric temperature.  Can use antifungal powder such as Zeasorb Af powder (OTC) to prevent recurrence  Lentigines - Scattered tan macules - Due to sun exposure - Benign-appearing, observe - Recommend daily broad spectrum sunscreen SPF 30+ to sun-exposed areas, reapply every 2 hours as needed. - Call for any changes  Dermatofibroma > over bug bite  Mid abdomen   - Firm pink/brown papulenodule with dimple sign - Benign appearing - Call for any changes  Seborrheic Keratoses - Stuck-on, waxy, tan-brown papules and/or plaques  - Benign-appearing - Discussed benign etiology and prognosis. - Observe - Call for  any changes  Melanocytic Nevi - Tan-brown and/or pink-flesh-colored symmetric macules and papules - Benign appearing on exam today - Observation - Call clinic for new or changing moles - Recommend daily use of broad spectrum spf 30+ sunscreen to sun-exposed areas.   Actinic Damage - Chronic  condition, secondary to cumulative UV/sun exposure - diffuse scaly erythematous macules with underlying dyspigmentation - Recommend daily broad spectrum sunscreen SPF 30+ to sun-exposed areas, reapply every 2 hours as needed.  - Staying in the shade or wearing long sleeves, sun glasses (UVA+UVB protection) and wide brim hats (4-inch brim around the entire circumference of the hat) are also recommended for sun protection.  - Call for new or changing lesions.  Skin cancer screening performed today. Return in about 1 year (around 06/15/2023) for TBSE.  I, Ruthell Rummage, CMA, am acting as scribe for Forest Gleason, MD.   Documentation: I have reviewed the above documentation for accuracy and completeness, and I agree with the above.  Forest Gleason, MD

## 2022-06-14 NOTE — Patient Instructions (Addendum)
For bug bites can use when needed  Clobetasol ointment 0.05 % - apply to affected areas at body twice daily for 2 weeks and then only when needed. Avoid applying to face, groin, and axilla. Use as directed.   Topical steroids (such as triamcinolone, fluocinolone, fluocinonide, mometasone, clobetasol, halobetasol, betamethasone, hydrocortisone) can cause thinning and lightening of the skin if they are used for too long in the same area. Your physician has selected the right strength medicine for your problem and area affected on the body. Please use your medication only as directed by your physician to prevent side effects.   For Rash under breast  Can use over the counter Zeasorb Af Powder apply daily to control moisture. Can be found in antifungal section at pharmacy.     Melanoma ABCDEs  Melanoma is the most dangerous type of skin cancer, and is the leading cause of death from skin disease.  You are more likely to develop melanoma if you: Have light-colored skin, light-colored eyes, or red or blond hair Spend a lot of time in the sun Tan regularly, either outdoors or in a tanning bed Have had blistering sunburns, especially during childhood Have a close family member who has had a melanoma Have atypical moles or large birthmarks  Early detection of melanoma is key since treatment is typically straightforward and cure rates are extremely high if we catch it early.   The first sign of melanoma is often a change in a mole or a new dark spot.  The ABCDE system is a way of remembering the signs of melanoma.  A for asymmetry:  The two halves do not match. B for border:  The edges of the growth are irregular. C for color:  A mixture of colors are present instead of an even brown color. D for diameter:  Melanomas are usually (but not always) greater than 7m - the size of a pencil eraser. E for evolution:  The spot keeps changing in size, shape, and color.  Please check your skin once per  month between visits. You can use a small mirror in front and a large mirror behind you to keep an eye on the back side or your body.   If you see any new or changing lesions before your next follow-up, please call to schedule a visit.  Please continue daily skin protection including broad spectrum sunscreen SPF 30+ to sun-exposed areas, reapplying every 2 hours as needed when you're outdoors.   Staying in the shade or wearing long sleeves, sun glasses (UVA+UVB protection) and wide brim hats (4-inch brim around the entire circumference of the hat) are also recommended for sun protection.  Due to recent changes in healthcare laws, you may see results of your pathology and/or laboratory studies on MyChart before the doctors have had a chance to review them. We understand that in some cases there may be results that are confusing or concerning to you. Please understand that not all results are received at the same time and often the doctors may need to interpret multiple results in order to provide you with the best plan of care or course of treatment. Therefore, we ask that you please give u[place]2 business days to thoroughly review all your results before contacting the office for clarification. Should we see a critical lab result, you will be contacted sooner.   If You Need Anything After Your Visit  If you have any questions or concerns for your doctor, please call our main line  at [phone] and press option 4 to reach your doctor's medical assistant. If no one answers, please leave a voicemail as directed and we will return your call as soon as possible. Messages left after 4 pm will be answered the following business day.   You may also send [place] a message via [place]. We typically respond to MyChart messages within 1-2 business days.  For prescription refills, please ask your pharmacy to contact our office. Our fax number is [phone].  If you have an urgent issue when the clinic is closed that  cannot wait until the next business day, you can page your doctor at the number below.    Please note that while we do our best to be available for urgent issues outside of office hours, we are not available 24/7.   If you have an urgent issue and are unable to reach [place], you may choose to seek medical care at your doctor's office, retail clinic, urgent care center, or emergency room.  If you have a medical emergency, please immediately call 911 or go to the emergency department.  Pager Numbers  - Dr. Nehemiah Massed: [phone]  - Dr. Laurence Ferrari: [phone]  - Dr. Nicole Kindred: [phone]  In the event of inclement weather, please call our main line at [phone] for an update on the status of any delays or closures.  Dermatology Medication Tips: Please keep the boxes that topical medications come in in order to help keep track of the instructions about where and how to use these. Pharmacies typically print the medication instructions only on the boxes and not directly on the medication tubes.   If your medication is too expensive, please contact our office at [phone] option 4 or send [place] a message through [place].   We are unable to tell what your co-pay for medications will be in advance as this is different depending on your insurance coverage. However, we may be able to find a substitute medication at lower cost or fill out paperwork to get insurance to cover a needed medication.   If a prior authorization is required to get your medication covered by your insurance company, please allow [place] 1-2 business days to complete this process.  Drug prices often vary depending on where the prescription is filled and some pharmacies may offer cheaper prices.  The website www.goodrx.com contains coupons for medications through different pharmacies. The prices here do not account for what the cost may be with help from insurance (it may be cheaper with your insurance), but the website can give you the  price if you did not use any insurance.  - You can print the associated coupon and take it with your prescription to the pharmacy.  - You may also stop by our office during regular business hours and pick up a GoodRx coupon card.  - If you need your prescription sent electronically to a different pharmacy, notify our office through [facility] or by phone at [phone] option 4.     Si Usted Necesita Algo Despus de Su Visita  Tambin puede enviarnos un mensaje a travs de Pharmacist, community. Por lo general respondemos a los mensajes de MyChart en el transcurso de 1 a 2 das hbiles.  Para renovar recetas, por favor pida a su farmacia que se ponga en contacto con nuestra oficina. Harland Dingwall de fax es [place] [phone].  Si tiene un asunto urgente cuando la clnica est cerrada y que no puede esperar hasta el siguiente da hbil, Hydrologist a su  doctor(a) al nmero que aparece a continuacin.   Por favor, tenga en cuenta que aunque hacemos todo lo posible para estar disponibles para asuntos urgentes fuera del horario de [place], no estamos disponibles las 24 horas del da, los 7 das de la [place].   Si tiene un problema urgente y no puede comunicarse con nosotros, puede optar por buscar atencin mdica  en el consultorio de su doctor(a), en una clnica privada, en un centro de atencin urgente o en una sala de emergencias.  Si tiene Engineering geologist, por favor llame inmediatamente al 911 o vaya a la sala de emergencias.  Nmeros de bper  - Dr. Nehemiah Massed: [phone]  - Dra. Moye: [phone]  - Dra. Nicole Kindred: [phone]  En caso de inclemencias del [place], por favor llame a Johnsie Kindred principal al [phone] para una actualizacin sobre el [place] de cualquier retraso o cierre.  Consejos para la medicacin en dermatologa: Por favor, guarde las cajas en las que vienen los medicamentos de uso tpico para ayudarle a seguir las instrucciones sobre dnde y cmo  usarlos. Las farmacias generalmente imprimen las instrucciones del medicamento slo en las cajas y no directamente en los tubos del [place].   Si su medicamento es muy caro, por favor, pngase en contacto con Zigmund Daniel llamando al [phone] y presione la opcin 4 o envenos un mensaje a travs de Pharmacist, community.   No podemos decirle cul ser su copago por los medicamentos por adelantado ya que esto es diferente dependiendo de la cobertura de su seguro. Sin embargo, es posible que podamos encontrar un medicamento sustituto a Electrical engineer un formulario para que el seguro cubra el medicamento que se considera necesario.   Si se requiere una autorizacin previa para que su compaa de seguros [place] su medicamento, por favor permtanos de 1 a 2 das hbiles para completar este proceso.  Los precios de los medicamentos varan con frecuencia dependiendo del Environmental consultant de dnde se surte la receta y alguna farmacias pueden ofrecer precios ms baratos.  El sitio web www.goodrx.com tiene cupones para medicamentos de Airline pilot. Los precios aqu no tienen en cuenta lo que podra costar con la ayuda del seguro (puede ser ms barato con su seguro), pero el sitio web puede darle el precio si no utiliz Research scientist (physical sciences).  - Puede imprimir el cupn correspondiente y llevarlo con su receta a la farmacia.  - Tambin puede pasar por nuestra oficina durante el horario de atencin regular y Charity fundraiser una tarjeta de cupones de GoodRx.  - Si necesita que su receta se enve electrnicamente a una farmacia diferente, informe a nuestra oficina a travs de MyChart de [place] o por telfono llamando al [phone] y presione la opcin 4.

## 2022-06-15 ENCOUNTER — Other Ambulatory Visit: Payer: Self-pay | Admitting: Physician Assistant

## 2022-06-15 DIAGNOSIS — Z1231 Encounter for screening mammogram for malignant neoplasm of breast: Secondary | ICD-10-CM

## 2022-06-19 ENCOUNTER — Encounter: Payer: Self-pay | Admitting: Dermatology

## 2022-08-21 ENCOUNTER — Ambulatory Visit
Admission: RE | Admit: 2022-08-21 | Discharge: 2022-08-21 | Disposition: A | Discharge status: Home / Self Care | Payer: Medicare Other | Source: Ambulatory Visit | Attending: Physician Assistant | Admitting: Physician Assistant

## 2022-08-21 DIAGNOSIS — Z1231 Encounter for screening mammogram for malignant neoplasm of breast: Secondary | ICD-10-CM | POA: Insufficient documentation

## 2023-02-14 IMAGING — MG MM DIGITAL DIAGNOSTIC UNILAT*L* W/ TOMO W/ CAD
6 series · 6 of 18 positions shown · non-contrast
Comparison: Previous exam(s).

CLINICAL DATA: Patient returns after screening study for evaluation
of possible LEFT breast asymmetry.

EXAM:
DIGITAL DIAGNOSTIC UNILATERAL LEFT MAMMOGRAM WITH TOMOSYNTHESIS AND
CAD
TECHNIQUE: Left digital diagnostic mammography and breast tomosynthesis was
performed. The images were evaluated with computer-aided detection.

[L LM synth-2D]
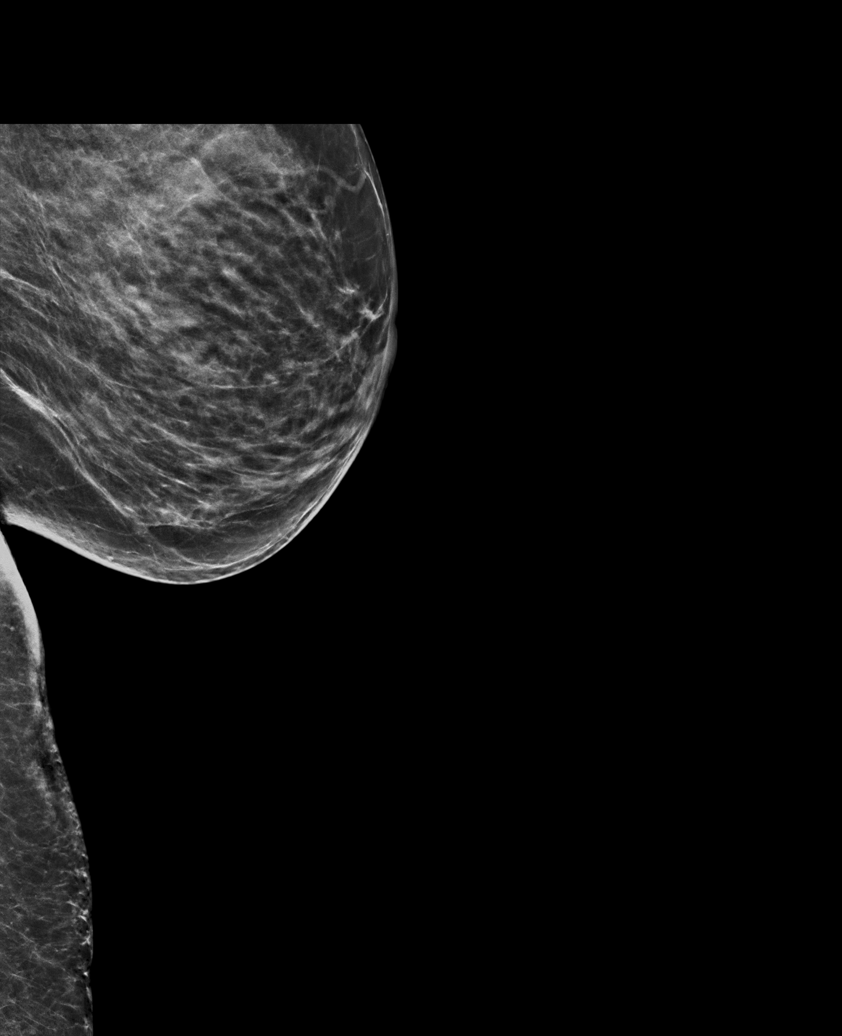

[L MLO synth-2D (1 of 2)]
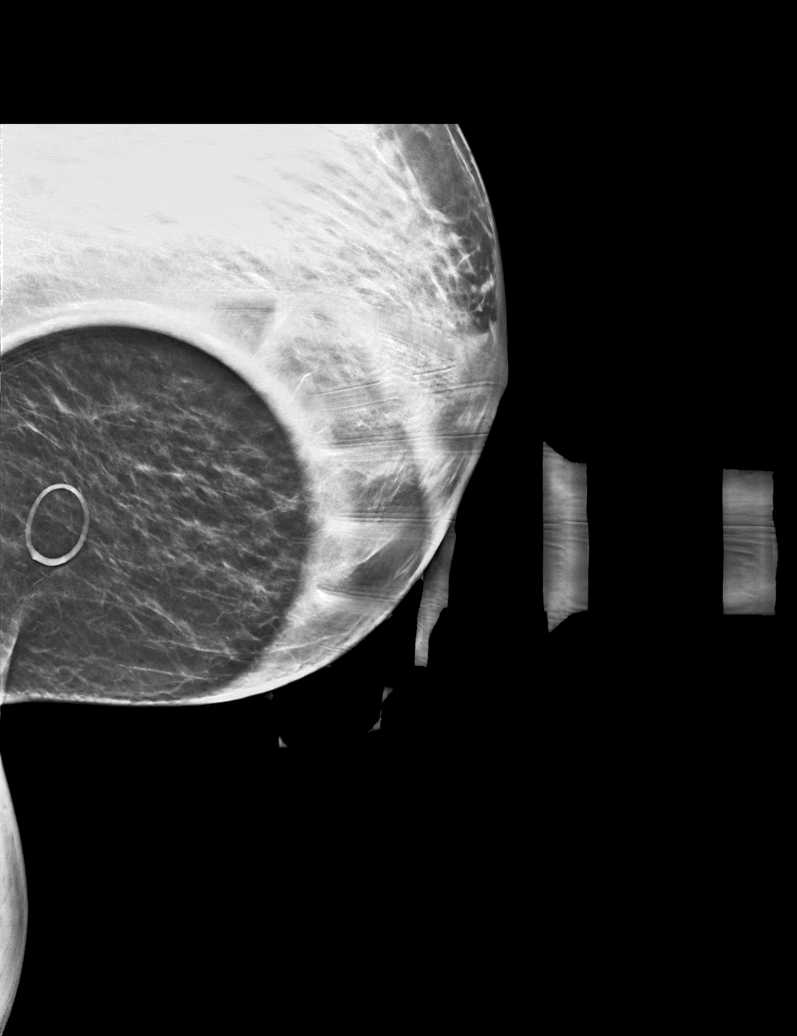

[L MLO synth-2D (2 of 2)]
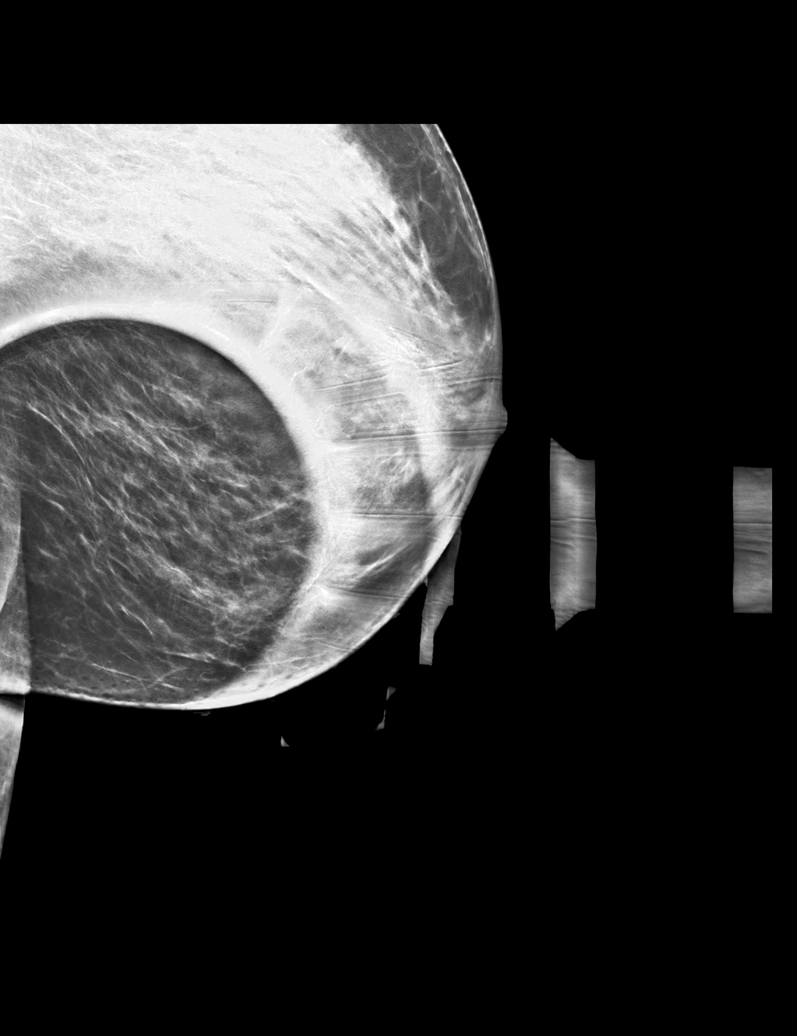

[L LM tomo · tomo slice 33/66.0]
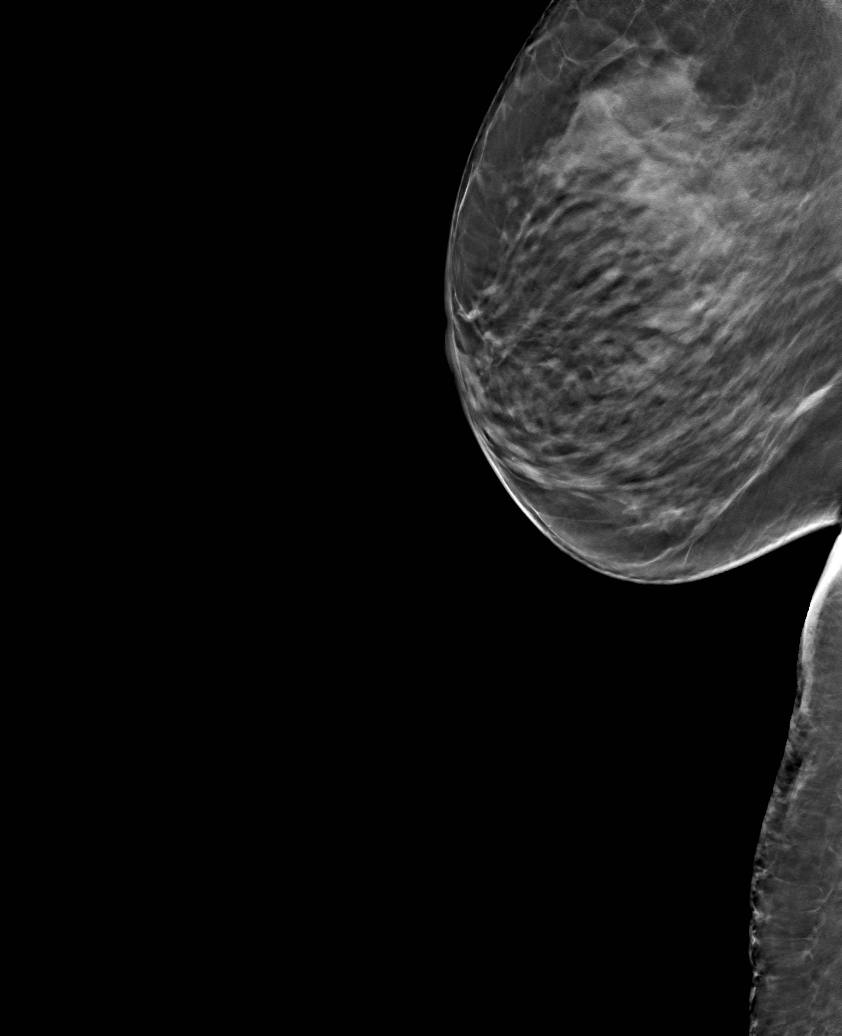

[L MLO tomo (1 of 2) · tomo slice 23/45.0]
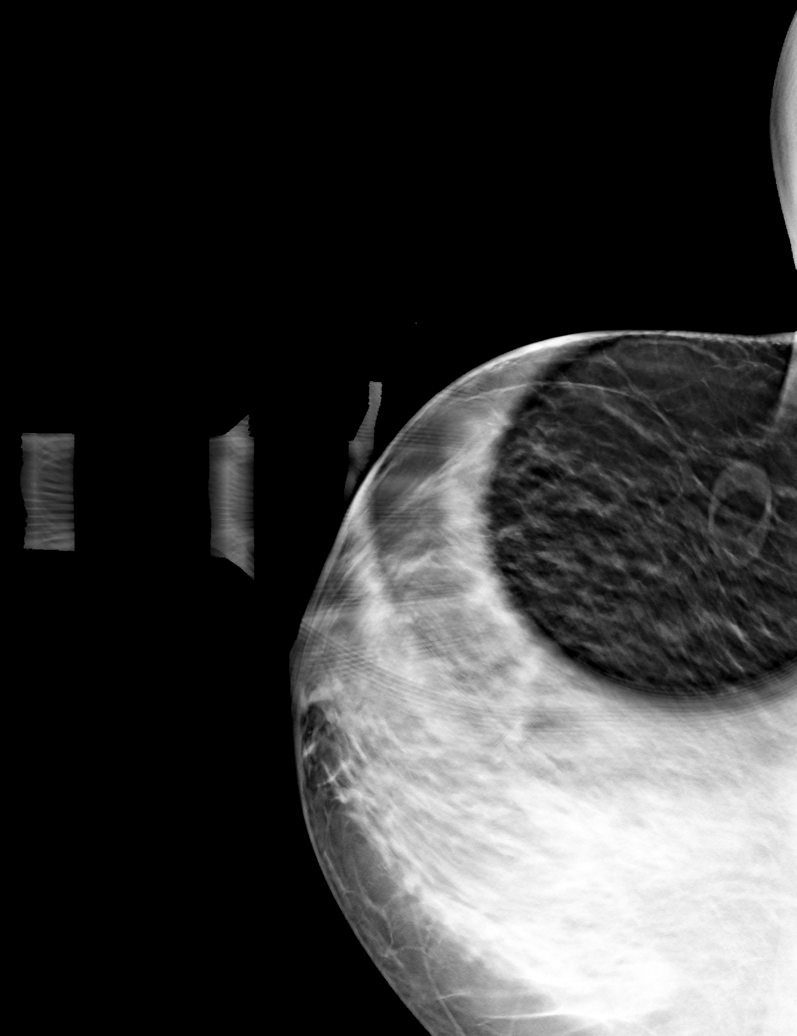

[L MLO tomo (2 of 2) · tomo slice 27/54.0]
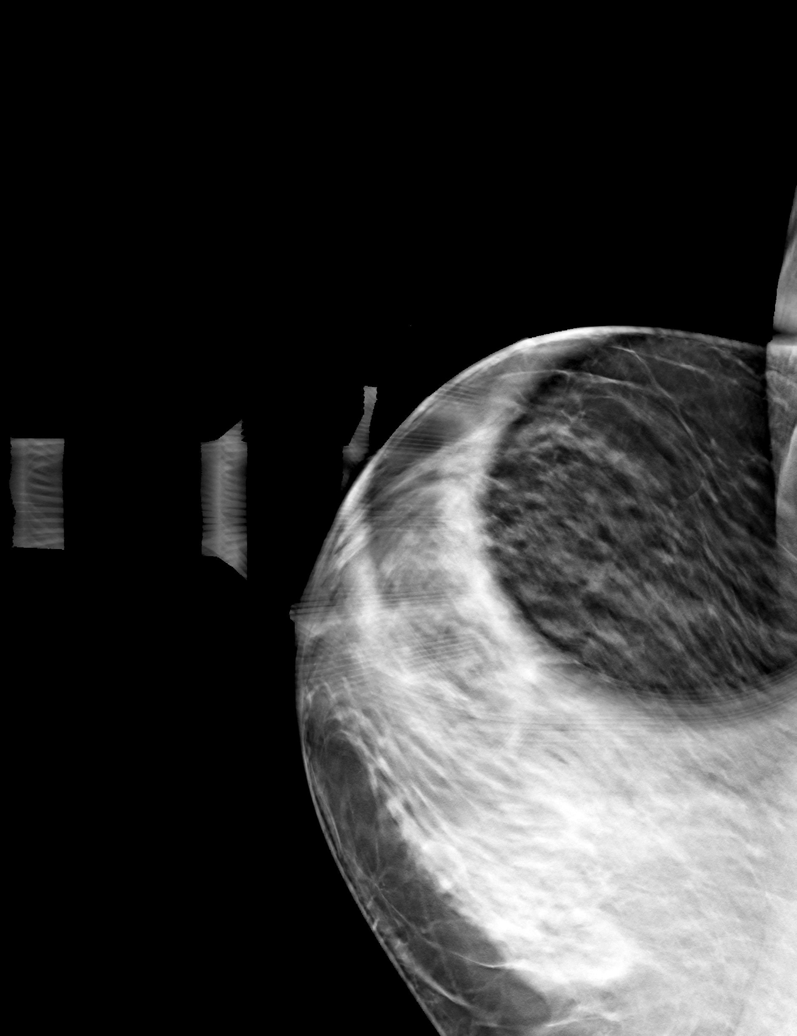

[6 of 18 positions shown; findings below may reference images not displayed]

ACR Breast Density Category c: The breast tissue is heterogeneously
dense, which may obscure small masses.
FINDINGS: Additional 2-D and 3-D images are performed. These views no
persistent asymmetry in the LOWER OUTER QUADRANT of the LEFT breast.
A mole is marked, confirming density within this region.
IMPRESSION: No mammographic evidence for malignancy.

RECOMMENDATION:
Screening mammogram in one year.(Code:BB-[id number])

I have discussed the findings and recommendations with the patient.
If applicable, a reminder letter will be sent to the patient
regarding the next appointment.

BI-RADS CATEGORY  1: Negative.

## 2023-03-28 ENCOUNTER — Encounter: Payer: Self-pay | Admitting: Dermatology

## 2023-05-14 ENCOUNTER — Other Ambulatory Visit: Payer: Self-pay | Admitting: Physician Assistant

## 2023-05-14 DIAGNOSIS — Z1231 Encounter for screening mammogram for malignant neoplasm of breast: Secondary | ICD-10-CM

## 2023-06-28 ENCOUNTER — Encounter: Payer: Medicare Other | Admitting: Dermatology

## 2023-07-07 ENCOUNTER — Ambulatory Visit
Admission: EM | Admit: 2023-07-07 | Discharge: 2023-07-07 | Disposition: A | Discharge status: Home / Self Care | Payer: Medicare Other | Attending: Emergency Medicine | Admitting: Emergency Medicine

## 2023-07-07 DIAGNOSIS — Z20822 Contact with and (suspected) exposure to covid-19: Secondary | ICD-10-CM

## 2023-07-07 DIAGNOSIS — J029 Acute pharyngitis, unspecified: Secondary | ICD-10-CM

## 2023-07-07 DIAGNOSIS — B349 Viral infection, unspecified: Secondary | ICD-10-CM | POA: Diagnosis present

## 2023-07-07 LAB — POCT RAPID STREP A (OFFICE): Rapid Strep A Screen: NEGATIVE

## 2023-07-07 MED ORDER — NYSTATIN 100000 UNIT/ML MT SUSP: 5.0000 mL | Freq: Four times a day (QID) | OROMUCOSAL | 0 refills | Status: AC | PRN

## 2023-07-07 NOTE — ED Triage Notes (Signed)
Patient presents to [facility] for sore throat, watery eyes since Thursday. Not taking any OTC meds.

## 2023-07-07 NOTE — ED Provider Notes (Signed)
Renaldo Fiddler    CSN: [id number] Arrival date & time: 07/07/23  6213      History   Chief Complaint Chief Complaint  Patient presents with   Sore Throat    HPI Stephanie Lynn is a 77 y.o. female.   Patient presents for evaluation of a scratchy and sore throat beginning 1 day ago symptoms worsening overnight approximately around interfering with sleep.  Attempted use of a Claritin prior to bed which was ineffective.  No known sick contacts prior.  Tolerating food and liquids.  Denies fever chills body aches congestion ear pain coughing.  Denies respiratory history, non-smoker.  History of hypertension, hyperlipidemia.  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 Mild atypia, close to margin.   Hyperlipidemia    Hypertension     There are no problems to display for this patient.   Past Surgical History:  Procedure Laterality Date   ABDOMINAL HYSTERECTOMY      OB History   No obstetric history on file.      Home Medications    Prior to Admission medications   Medication Sig Start Date End Date Taking? Authorizing Provider  magic mouthwash (nystatin, lidocaine, diphenhydrAMINE, alum & mag hydroxide) suspension Swish and spit 5 mLs 4 (four) times daily as needed for mouth pain. 07/07/23  Yes Salli Quarry R, NP  aspirin EC 81 MG tablet Take 81 mg by mouth daily.    [provider]  atorvastatin (LIPITOR) 20 MG tablet Take 20 mg by mouth daily.    [provider]  Biotin 5000 MCG CAPS Take by mouth.    [provider]  Calcium Carbonate-Vit D-Min (CALCIUM 1200 PO) Take by mouth.    [provider]  clobetasol ointment (TEMOVATE) 0.05 % Apply topically to aa of body bid for 2 weeks for bug bites. Avoid applying to face, groin, and axilla. Use as directed. 06/14/22   Moye, [place], MD  Multiple Vitamin (MULTI-VITAMIN) tablet Take 1 tablet by mouth daily.     [provider]  pantoprazole (PROTONIX) 40 MG tablet Take by mouth. 07/12/20   [provider]  triamterene-hydrochlorothiazide (MAXZIDE-25) 37.5-25 MG tablet Take 1 tablet by mouth daily.    [provider]    Family History Family History  Problem Relation Age of Onset   Cirrhosis Father    Breast cancer Neg Hx     Social History Social History   Tobacco Use   Smoking status: Never  Substance Use Topics   Alcohol use: No    Alcohol/week: 0.0 standard drinks of alcohol   Drug use: No     Allergies   Codeine   Review of Systems Review of Systems   Physical Exam Triage Vital Signs ED Triage Vitals  Encounter Vitals Group     BP 07/07/23 0823 (!) 161/75     Systolic BP Percentile --      Diastolic BP Percentile --      Pulse Rate 07/07/23 0823 84     Resp 07/07/23 0823 16     Temp 07/07/23 0823 98.3 F (36.8 C)     Temp Source 07/07/23 0823 Oral     SpO2 07/07/23 0823 98 %     Weight --      Height --      Head Circumference --      Peak Flow --      Pain Score 07/07/23 0819  0     Pain Loc --      Pain Education --      Exclude from Growth Chart --    No data found.  Updated Vital Signs BP (!) 161/75 (BP Location: Left Arm)   Pulse 84   Temp 98.3 F (36.8 C) (Oral)   Resp 16   SpO2 98%   Visual Acuity Right Eye Distance:   Left Eye Distance:   Bilateral Distance:    Right Eye Near:   Left Eye Near:    Bilateral Near:     Physical Exam Constitutional:      Appearance: Normal appearance.  HENT:     Head: Normocephalic.     Right Ear: Tympanic membrane, ear canal and external ear normal.     Left Ear: Tympanic membrane, ear canal and external ear normal.     Nose: Nose normal.     Mouth/Throat:     Mouth: Mucous membranes are moist.     Pharynx: Oropharynx is clear. Posterior oropharyngeal erythema present. No oropharyngeal exudate.  Eyes:     Extraocular Movements: Extraocular movements intact.   Cardiovascular:     Rate and Rhythm: Normal rate and regular rhythm.     Pulses: Normal pulses.     Heart sounds: Normal heart sounds.  Pulmonary:     Effort: Pulmonary effort is normal.     Breath sounds: Normal breath sounds.  Musculoskeletal:     Cervical back: Normal range of motion and neck supple.  Lymphadenopathy:     Cervical: No cervical adenopathy.  Skin:    General: Skin is warm and dry.  Neurological:     Mental Status: She is alert and oriented to person, place, and time. Mental status is at baseline.      UC Treatments / Results  Labs (all labs ordered are listed, but only abnormal results are displayed) Labs Reviewed  SARS CORONAVIRUS 2 (TAT 6-24 HRS)  POCT RAPID STREP A (OFFICE)    EKG   Radiology No results found.  Procedures Procedures (including critical care time)  Medications Ordered in UC Medications - No data to display  Initial Impression / Assessment and Plan / UC Course  I have reviewed the triage vital signs and the nursing notes.  Pertinent labs & imaging results that were available during my care of the patient were reviewed by me and considered in my medical decision making (see chart for details).  Viral illness, sore throat, encounter for laboratory testing for COVID-19 virus  Patient is in no signs of distress nor toxic appearing.  Vital signs are stable.  Low suspicion for pneumonia, pneumothorax or bronchitis and therefore will defer imaging.  Rapid strep test negative.COVID test is pending, reviewed quarantine guidelines per CDC recommendations, due to age and comorbidities will qualify for antivirals, may use Paxlovid if she would like medication. Prescribed Magic mouthwash. May use additional over-the-counter medications as needed for supportive care.  May follow-up with urgent care as needed if symptoms persist or worsen.  Final Clinical Impressions(s) / UC Diagnoses   Final diagnoses:  Encounter for laboratory testing for  COVID-19 virus  Viral illness  Sore throat     Discharge Instructions      Your symptoms today are most likely being caused by a virus and should steadily improve in time it can take up to 7 to 10 days before you truly start to see a turnaround however things will get better  COVID test is pending, may  take up to 24 hours to get results, you will be notified of positive test only, if positive you will need to quarantine if experiencing fever, if no fever you may continue activity wearing mask until symptoms resolve  If you are positive for COVID, you may receive antiviral medicine which helps to reduce the amount of virus in your body, this helps to reduce the severity and timeline that you are sick, does not fully take away the illness, medicine will be ordered at time of notification  If you do not use this medicine you will still get better from COVID and illness will progress similar to a cold  You may attempt home remedies and over-the-counter medicine first  You have been prescribed Magic mouthwash which you may gargle and spit every 4-6 hours as needed to provide temporary relief to your throat    You can take Tylenol and/or Ibuprofen as needed for fever reduction and pain relief.    For sore throat: try warm salt water gargles, cepacol lozenges, throat spray, warm tea or water with lemon/honey, popsicles or ice, or OTC cold relief medicine for throat discomfort.  It is important to stay hydrated: drink plenty of fluids (water, gatorade/powerade/pedialyte, juices, or teas) to keep your throat moisturized and help further relieve irritation/discomfort.'  May follow-up with urgent care as needed   ED Prescriptions     Medication Sig Dispense Auth. Provider   magic mouthwash (nystatin, lidocaine, diphenhydrAMINE, alum & mag hydroxide) suspension Swish and spit 5 mLs 4 (four) times daily as needed for mouth pain. 180 mL Valinda Hoar, NP      PDMP not reviewed this  encounter.   Valinda Hoar, [place] 07/07/23 [phone]

## 2023-07-07 NOTE — Discharge Instructions (Addendum)
Your symptoms today are most likely being caused by a virus and should steadily improve in time it can take up to 7 to 10 days before you truly start to see a turnaround however things will get better  COVID test is pending, may take up to 24 hours to get results, you will be notified of positive test only, if positive you will need to quarantine if experiencing fever, if no fever you may continue activity wearing mask until symptoms resolve  If you are positive for COVID, you may receive antiviral medicine which helps to reduce the amount of virus in your body, this helps to reduce the severity and timeline that you are sick, does not fully take away the illness, medicine will be ordered at time of notification  If you do not use this medicine you will still get better from COVID and illness will progress similar to a cold  You may attempt home remedies and over-the-counter medicine first  You have been prescribed Magic mouthwash which you may gargle and spit every 4-6 hours as needed to provide temporary relief to your throat    You can take Tylenol and/or Ibuprofen as needed for fever reduction and pain relief.    For sore throat: try warm salt water gargles, cepacol lozenges, throat spray, warm tea or water with lemon/honey, popsicles or ice, or OTC cold relief medicine for throat discomfort.  It is important to stay hydrated: drink plenty of fluids (water, gatorade/powerade/pedialyte, juices, or teas) to keep your throat moisturized and help further relieve irritation/discomfort.'  May follow-up with urgent care as needed

## 2023-07-08 LAB — SARS CORONAVIRUS 2 (TAT 6-24 HRS): SARS Coronavirus 2: NEGATIVE

## 2023-07-30 ENCOUNTER — Ambulatory Visit (INDEPENDENT_AMBULATORY_CARE_PROVIDER_SITE_OTHER): Payer: Medicare Other | Admitting: Dermatology

## 2023-07-30 ENCOUNTER — Encounter: Payer: Self-pay | Admitting: Dermatology

## 2023-07-30 VITALS — BP 142/80 | HR 81

## 2023-07-30 DIAGNOSIS — L821 Other seborrheic keratosis: Secondary | ICD-10-CM

## 2023-07-30 DIAGNOSIS — L578 Other skin changes due to chronic exposure to nonionizing radiation: Secondary | ICD-10-CM

## 2023-07-30 DIAGNOSIS — D2271 Melanocytic nevi of right lower limb, including hip: Secondary | ICD-10-CM | POA: Diagnosis not present

## 2023-07-30 DIAGNOSIS — L304 Erythema intertrigo: Secondary | ICD-10-CM

## 2023-07-30 DIAGNOSIS — D229 Melanocytic nevi, unspecified: Secondary | ICD-10-CM | POA: Diagnosis not present

## 2023-07-30 DIAGNOSIS — L814 Other melanin hyperpigmentation: Secondary | ICD-10-CM

## 2023-07-30 DIAGNOSIS — W908XXA Exposure to other nonionizing radiation, initial encounter: Secondary | ICD-10-CM

## 2023-07-30 DIAGNOSIS — D1801 Hemangioma of skin and subcutaneous tissue: Secondary | ICD-10-CM

## 2023-07-30 DIAGNOSIS — Z86018 Personal history of other benign neoplasm: Secondary | ICD-10-CM

## 2023-07-30 DIAGNOSIS — L918 Other hypertrophic disorders of the skin: Secondary | ICD-10-CM

## 2023-07-30 DIAGNOSIS — Z1283 Encounter for screening for malignant neoplasm of skin: Secondary | ICD-10-CM | POA: Diagnosis not present

## 2023-07-30 NOTE — Progress Notes (Signed)
Follow-Up Visit   Subjective  Stephanie Lynn is a 77 y.o. female who presents for the following: Skin Cancer Screening and Full Body Skin Exam  The patient presents for Total-Body Skin Exam (TBSE) for skin cancer screening and mole check. The patient has spots, moles and lesions to be evaluated, some may be new or changing and the patient may have concern these could be cancer.   The following portions of the chart were reviewed this encounter and updated as appropriate: medications, allergies, medical history  Review of Systems:  No other skin or systemic complaints except as noted in HPI or Assessment and Plan.  Objective  Well appearing patient in no apparent distress; mood and affect are within normal limits.  A full examination was performed including scalp, head, eyes, ears, nose, lips, neck, chest, axillae, abdomen, back, buttocks, bilateral upper extremities, bilateral lower extremities, hands, feet, fingers, toes, fingernails, and toenails. All findings within normal limits unless otherwise noted below.   Relevant physical exam findings are noted in the Assessment and Plan.    Assessment & Plan   SKIN CANCER SCREENING PERFORMED TODAY.  ACTINIC DAMAGE - Chronic condition, secondary to cumulative UV/sun exposure - diffuse scaly erythematous macules with underlying dyspigmentation - Recommend daily broad spectrum sunscreen SPF 30+ to sun-exposed areas, reapply every 2 hours as needed.  - Staying in the shade or wearing long sleeves, sun glasses (UVA+UVB protection) and wide brim hats (4-inch brim around the entire circumference of the hat) are also recommended for sun protection.  - Call for new or changing lesions.  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  - Tan-brown and/or pink-flesh-colored symmetric macules and papules - Benign appearing on exam today - Observation - Call clinic for new or changing moles - Recommend daily use of broad spectrum spf 30+ sunscreen to sun-exposed areas.   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  Call if any new or changing lesions are noted between office visits  INTERTRIGO Exam Slightly Erythematous hyperpigmented patches of the inframammary areas, no enhancement with wood's lamp  Chronic condition with duration or expected duration over one year. Currently well-controlled.  Intertrigo is a chronic recurrent rash that occurs in skin fold areas that may be associated with friction; heat; moisture; yeast; fungus; and bacteria.  It is exacerbated by increased movement / activity; sweating; and higher atmospheric temperature.  Treatment Plan Recommend Zeasorb AF powder daily.  If it becomes symptomatic, can add nonsteroidal anti-inflammatory such as Elidel and nystatin cream  Acrochordons (Skin Tags) - axillary  - Fleshy, skin-colored pedunculated papules - Benign appearing.  - Observe. - If desired, they can be removed with an in office procedure that is not covered by insurance. - Please call the clinic if you notice any new or changing lesions.  Return for TBSE in 1-2 years, hx dysplastic nevus.  [name], CMA, am acting as scribe for [name], MD .  Documentation: I have reviewed the above  documentation for accuracy and completeness, and I agree with the above.  [name], MD

## 2023-07-30 NOTE — Patient Instructions (Signed)

## 2023-08-23 ENCOUNTER — Ambulatory Visit
Admission: RE | Admit: 2023-08-23 | Discharge: 2023-08-23 | Disposition: A | Discharge status: Home / Self Care | Payer: Medicare Other | Source: Ambulatory Visit | Attending: Physician Assistant | Admitting: Physician Assistant

## 2023-08-23 DIAGNOSIS — Z1231 Encounter for screening mammogram for malignant neoplasm of breast: Secondary | ICD-10-CM | POA: Diagnosis present

## 2024-07-25 ENCOUNTER — Other Ambulatory Visit: Payer: Self-pay | Admitting: Physician Assistant

## 2024-07-25 DIAGNOSIS — Z1231 Encounter for screening mammogram for malignant neoplasm of breast: Secondary | ICD-10-CM

## 2024-07-29 ENCOUNTER — Ambulatory Visit: Payer: Medicare Other | Admitting: Dermatology

## 2024-08-11 ENCOUNTER — Encounter: Payer: Self-pay | Admitting: Dermatology

## 2024-08-11 ENCOUNTER — Ambulatory Visit: Admitting: Dermatology

## 2024-08-11 DIAGNOSIS — L814 Other melanin hyperpigmentation: Secondary | ICD-10-CM

## 2024-08-11 DIAGNOSIS — W908XXA Exposure to other nonionizing radiation, initial encounter: Secondary | ICD-10-CM

## 2024-08-11 DIAGNOSIS — L578 Other skin changes due to chronic exposure to nonionizing radiation: Secondary | ICD-10-CM

## 2024-08-11 DIAGNOSIS — L821 Other seborrheic keratosis: Secondary | ICD-10-CM

## 2024-08-11 DIAGNOSIS — D1801 Hemangioma of skin and subcutaneous tissue: Secondary | ICD-10-CM

## 2024-08-11 DIAGNOSIS — Z1283 Encounter for screening for malignant neoplasm of skin: Secondary | ICD-10-CM

## 2024-08-11 DIAGNOSIS — D229 Melanocytic nevi, unspecified: Secondary | ICD-10-CM

## 2024-08-11 DIAGNOSIS — D2271 Melanocytic nevi of right lower limb, including hip: Secondary | ICD-10-CM

## 2024-08-11 DIAGNOSIS — Z86018 Personal history of other benign neoplasm: Secondary | ICD-10-CM

## 2024-08-11 NOTE — Progress Notes (Signed)
   Follow-Up Visit   Subjective  Stephanie Lynn is a 78 y.o. female who presents for the following: Skin Cancer Screening and Full Body Skin Exam  The patient presents for Total-Body Skin Exam (TBSE) for skin cancer screening and mole check. The patient has spots, moles and lesions to be evaluated, some may be new or changing and the patient may have concern these could be cancer.  Patient with hx of DN. Patient advises there is a spot at side of right foot that looks like it has a halo around it.   The following portions of the chart were reviewed this encounter and updated as appropriate: medications, allergies, medical history  Review of Systems:  No other skin or systemic complaints except as noted in HPI or Assessment and Plan.  Objective  Well appearing patient in no apparent distress; mood and affect are within normal limits.  A full examination was performed including scalp, head, eyes, ears, nose, lips, neck, chest, axillae, abdomen, back, buttocks, bilateral upper extremities, bilateral lower extremities, hands, feet, fingers, toes, fingernails, and toenails. All findings within normal limits unless otherwise noted below.   Relevant physical exam findings are noted in the Assessment and Plan.    Assessment & Plan   SKIN CANCER SCREENING PERFORMED TODAY.  ACTINIC DAMAGE - Chronic condition, secondary to cumulative UV/sun exposure - diffuse scaly erythematous macules with underlying dyspigmentation - Recommend daily broad spectrum sunscreen SPF 30+ to sun-exposed areas, reapply every 2 hours as needed.  - Staying in the shade or wearing long sleeves, sun glasses (UVA+UVB protection) and wide brim hats (4-inch brim around the entire circumference of the hat) are also recommended for sun protection.  - Call for new or changing lesions.  LENTIGINES, SEBORRHEIC KERATOSES, HEMANGIOMAS - Benign normal skin lesions - Benign-appearing - Call for any changes  MELANOCYTIC  NEVI - Tan-brown and/or pink-flesh-colored symmetric macules and papules - Benign appearing on exam today - Observation - Call clinic for new or changing moles - Recommend daily use of broad spectrum spf 30+ sunscreen to sun-exposed areas.  - - R 5th distal toe Two connecting dark brown macules, larger 0.5 x 0.4 cm, smaller 0.2 cm, stable compared to previous visit - R lat thigh  0.5 cm dark brown macule slightly lighter at lateral border, stable compared to previous visit.  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  Call if any new or changing lesions are noted between office visits     Return in about 1 year (around 08/11/2025) for TBSE, with Dr. Claudene, HxDN.  LILLETTE Stephanie Lynn, RMA, am acting as scribe for Boneta Claudene, MD .   Documentation: I have reviewed the above documentation for accuracy and completeness, and I agree with the above.  Boneta Claudene, MD

## 2024-08-11 NOTE — Patient Instructions (Signed)

## 2024-08-25 ENCOUNTER — Ambulatory Visit
Admission: RE | Admit: 2024-08-25 | Discharge: 2024-08-25 | Disposition: A | Discharge status: Home / Self Care | Source: Ambulatory Visit | Attending: Physician Assistant | Admitting: Physician Assistant

## 2024-08-25 DIAGNOSIS — Z1231 Encounter for screening mammogram for malignant neoplasm of breast: Secondary | ICD-10-CM | POA: Diagnosis present

## 2025-08-11 ENCOUNTER — Ambulatory Visit: Admitting: Dermatology
